# Patient Record
Sex: Female | Born: 1935 | Race: Black or African American | Smoking: Former smoker
Health system: Southern US, Community
[De-identification: ages and names within clinical notes are randomized; demographics above are authoritative.]

## PROBLEM LIST (undated history)

## (undated) DIAGNOSIS — T8859XA Other complications of anesthesia, initial encounter: Secondary | ICD-10-CM

## (undated) DIAGNOSIS — I2510 Atherosclerotic heart disease of native coronary artery without angina pectoris: Secondary | ICD-10-CM

## (undated) DIAGNOSIS — I509 Heart failure, unspecified: Secondary | ICD-10-CM

## (undated) DIAGNOSIS — C799 Secondary malignant neoplasm of unspecified site: Secondary | ICD-10-CM

## (undated) DIAGNOSIS — J3800 Paralysis of vocal cords and larynx, unspecified: Secondary | ICD-10-CM

## (undated) DIAGNOSIS — T4145XA Adverse effect of unspecified anesthetic, initial encounter: Secondary | ICD-10-CM

## (undated) DIAGNOSIS — C801 Malignant (primary) neoplasm, unspecified: Secondary | ICD-10-CM

## (undated) DIAGNOSIS — J449 Chronic obstructive pulmonary disease, unspecified: Secondary | ICD-10-CM

## (undated) DIAGNOSIS — I10 Essential (primary) hypertension: Secondary | ICD-10-CM

## (undated) HISTORY — PX: BREAST SURGERY: SHX581

## (undated) HISTORY — PX: SMALL INTESTINE SURGERY: SHX150

## (undated) HISTORY — PX: ABDOMINAL SURGERY: SHX537

## (undated) HISTORY — PX: THYROID SURGERY: SHX805

## (undated) HISTORY — PX: TRACHEOSTOMY: SUR1362

## (undated) HISTORY — PX: CARDIAC SURGERY: SHX584

---

## 2013-03-04 ENCOUNTER — Telehealth: Payer: Self-pay | Admitting: *Deleted

## 2013-03-04 NOTE — Telephone Encounter (Signed)
Received paperwork and confirmed 03/10/13 appt w/ pts daughter Johnny Bridge).  Mailed before appt letter & packet to pt.  Took paperwork to [facility] for chart.

## 2013-03-10 ENCOUNTER — Other Ambulatory Visit (HOSPITAL_BASED_OUTPATIENT_CLINIC_OR_DEPARTMENT_OTHER): Payer: Medicare Other | Admitting: Lab

## 2013-03-10 ENCOUNTER — Ambulatory Visit (HOSPITAL_BASED_OUTPATIENT_CLINIC_OR_DEPARTMENT_OTHER): Payer: Medicare Other | Admitting: Oncology

## 2013-03-10 ENCOUNTER — Encounter: Payer: Self-pay | Admitting: Oncology

## 2013-03-10 ENCOUNTER — Ambulatory Visit: Payer: Medicare Other

## 2013-03-10 ENCOUNTER — Other Ambulatory Visit: Payer: Self-pay | Admitting: Emergency Medicine

## 2013-03-10 ENCOUNTER — Telehealth: Payer: Self-pay | Admitting: Oncology

## 2013-03-10 VITALS — BP 114/67 | HR 79 | Temp 98.4°F | Resp 20 | Wt 142.2 lb

## 2013-03-10 DIAGNOSIS — C50919 Malignant neoplasm of unspecified site of unspecified female breast: Secondary | ICD-10-CM

## 2013-03-10 DIAGNOSIS — C50912 Malignant neoplasm of unspecified site of left female breast: Secondary | ICD-10-CM

## 2013-03-10 DIAGNOSIS — C772 Secondary and unspecified malignant neoplasm of intra-abdominal lymph nodes: Secondary | ICD-10-CM

## 2013-03-10 LAB — COMPREHENSIVE METABOLIC PANEL (CC13)
BUN: 16.5 mg/dL (ref 7.0–26.0)
CO2: 23 mEq/L (ref 22–29)
Creatinine: 1.5 mg/dL — ABNORMAL HIGH (ref 0.6–1.1)
Glucose: 93 mg/dl (ref 70–140)
Total Bilirubin: 0.42 mg/dL (ref 0.20–1.20)

## 2013-03-10 LAB — CBC WITH DIFFERENTIAL/PLATELET
Eosinophils Absolute: 0.1 10*3/uL (ref 0.0–0.5)
HCT: 42.8 % (ref 34.8–46.6)
LYMPH%: 33.1 % (ref 14.0–49.7)
MCV: 92.4 fL (ref 79.5–101.0)
MONO#: 0.9 10*3/uL (ref 0.1–0.9)
MONO%: 11.4 % (ref 0.0–14.0)
NEUT#: 3.9 10*3/uL (ref 1.5–6.5)
NEUT%: 53.1 % (ref 38.4–76.8)
Platelets: 222 10*3/uL (ref 145–400)
WBC: 7.4 10*3/uL (ref 3.9–10.3)

## 2013-03-10 MED ORDER — LETROZOLE 2.5 MG PO TABS: 2.5000 mg | ORAL_TABLET | Freq: Every day | ORAL | Status: DC

## 2013-03-10 NOTE — Progress Notes (Signed)
Checked in new pt with no financial concerns. °

## 2013-03-10 NOTE — Telephone Encounter (Signed)
, °

## 2013-03-10 NOTE — Patient Instructions (Addendum)
Begin femara 2.5 mg daily for your breast cancer  We discussed the side effects  Letrozole tablets What is this medicine? LETROZOLE (LET roe zole) blocks the production of estrogen. Certain types of breast cancer grow under the influence of estrogen. Letrozole helps block tumor growth. This medicine is used to treat advanced breast cancer in postmenopausal women. This medicine may be used for other purposes; ask your health care provider or pharmacist if you have questions. What should I tell my health care provider before I take this medicine? They need to know if you have any of these conditions: -liver disease -osteoporosis (weak bones) -an unusual or allergic reaction to letrozole, other medicines, foods, dyes, or preservatives -pregnant or trying to get pregnant -breast-feeding How should I use this medicine? Take this medicine by mouth with a glass of water. You may take it with or without food. Follow the directions on the prescription label. Take your medicine at regular intervals. Do not take your medicine more often than directed. Do not stop taking except on your doctor's advice. Talk to your pediatrician regarding the use of this medicine in children. Special care may be needed. Overdosage: If you think you have taken too much of this medicine contact a poison control center or emergency room at once. NOTE: This medicine is only for you. Do not share this medicine with others. What if I miss a dose? If you miss a dose, take it as soon as you can. If it is almost time for your next dose, take only that dose. Do not take double or extra doses. What may interact with this medicine? Do not take this medicine with any of the following medications: -estrogens, like hormone replacement therapy or birth control pills This medicine may also interact with the following medications: -dietary supplements such as androstenedione or DHEA -prasterone -tamoxifen This list may not describe  all possible interactions. Give your health care provider a list of all the medicines, herbs, non-prescription drugs, or dietary supplements you use. Also tell them if you smoke, drink alcohol, or use illegal drugs. Some items may interact with your medicine. What should I watch for while using this medicine? Visit your doctor or health care professional for regular check-ups to monitor your condition. Do not use this drug if you are pregnant. Serious side effects to an unborn child are possible. Talk to your doctor or pharmacist for more information. You may get drowsy or dizzy. Do not drive, use machinery, or do anything that needs mental alertness until you know how this medicine affects you. Do not stand or sit up quickly, especially if you are an older patient. This reduces the risk of dizzy or fainting spells. What side effects may I notice from receiving this medicine? Side effects that you should report to your doctor or health care professional as soon as possible: -allergic reactions like skin rash, itching, or hives -bone fracture -chest pain -difficulty breathing or shortness of breath -severe pain, swelling, warmth in the leg -unusually weak or tired -vaginal bleeding Side effects that usually do not require medical attention (report to your doctor or health care professional if they continue or are bothersome): -bone, back, joint, or muscle pain -dizziness -fatigue -fluid retention -headache -hot flashes, night sweats -nausea -weight gain This list may not describe all possible side effects. Call your doctor for medical advice about side effects. You may report side effects to FDA at 1-800-FDA-1088. Where should I keep my medicine? Keep out of the reach  of children. Store between 15 and 30 degrees C (59 and 86 degrees F). Throw away any unused medicine after the expiration date. NOTE: This sheet is a summary. It may not cover all possible information. If you have questions  about this medicine, talk to your doctor, pharmacist, or health care provider.  2013, [employer]vier/[employer] Standard. (08/28/2007 4:43:44 PM)

## 2013-03-21 NOTE — Progress Notes (Signed)
Danielle Bullock [id number] 1922-03-12 79 y.o. 03/21/2013 8:06 PM  CC Dr. Ovidio Kin Dr. Dorothyann Peng  REASON FOR CONSULTATION:  79 year old female with metastatic carcinoma unknown primary. patient is relocating to [facility] to be closer to her family.  STAGE:  Stage IV with liver metastasis.   REFERRING PHYSICIAN: Dr. Spero Curb  HISTORY OF PRESENT ILLNESS:  Danielle Bullock is a 79 y.o. female.  With multiple medical problems including COPD congestive heart failure coronary artery disease. Patient developed hoarseness for about a 3 month period. She went to be in 2 for an evaluation and was found to have one of local cord paralysis. She subsequently had CT scans performed of the chest. These scans failed to show any definite mediastinal adenopathy or other primary pathology but it did show multifocal bilateral nodules in both lungs. In the liver she was found to have a 2.5 x 3 cm lesion suspicious for metastatic disease. Patient apparently has a history of having had breast cancer about 10 years ago which was treated with a left mastectomy and 5 years of tamoxifen. Because of the liver lesion patient had a biopsy performed that showed carcinoma with some neuroendocrine features. Unknown primary. Patient had CA 27-29 marker that was moderately elevated 74.3 CEA 10.3 and  Chromogranin A. Was borderline elevated at 7 an alpha-fetoprotein was negative.special stains were performed on the biopsy including EGF mutation which was negative therefore making it difficult to treat with Tarceva or a thick neck. She was also HER-2/neu negative. Past ID gene RA was sent but there was no time to be completed. Patient has relocated to [facility].  Patient's original oncologist recommended that he patient to put on aromatase inhibitor I letrozole 2.5 mg daily. Other 8 discussion was chemotherapy for lung cancer with pemetrexed and carboplatinum or pemetrexed alone. Patient did not  want to have chemotherapy.   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  #3 vocal cord paresis  #5 hyperlipidemia, hypertension, ASHD, COPD Past Surgical History: Patient is status post mastectomy Family History: noncontributory Social History History  Substance Use Topics  . Smoking status: Not on file  . Smokeless tobacco: Not on file  . Alcohol Use: Not on file    Allergies: No Known Allergies  Current Medications: Current Outpatient Prescriptions  Medication Sig Dispense Refill  . albuterol (PROAIR HFA) 108 (90 BASE) MCG/ACT inhaler Inhale 2 puffs into the lungs every 6 (six) hours as needed for wheezing.      Marland Kitchen amLODipine-benazepril (LOTREL) 10-20 MG per capsule Take 1 capsule by mouth daily.      Marland Kitchen aspirin 325 MG tablet Take 325 mg by mouth daily.      . budesonide (PULMICORT) 0.5 MG/2ML nebulizer solution Take 0.5 mg by nebulization 2 (two) times daily.      . Calcium Carbonate (CALCIUM 600 PO) Take 600 mg by mouth.      . Cholecalciferol (VITAMIN D3) 2000 UNITS TABS Take 2,000 Int'l Units by mouth daily.      . clopidogrel (PLAVIX) 75 MG tablet Take 75 mg by mouth daily.      Marland Kitchen ezetimibe (ZETIA) 10 MG tablet Take 10 mg by mouth daily.      . furosemide (LASIX) 40 MG tablet Take 40 mg by mouth daily.      Marland Kitchen lovastatin (MEVACOR) 40 MG tablet Take 80 mg  by mouth at bedtime.      . potassium chloride SA (K-DUR,KLOR-CON) 20 MEQ tablet Take 20 mEq by mouth daily.      . propranolol (INDERAL) 40 MG tablet Take 40 mg by mouth daily.      . Sennosides (SENNA LAX PO) Take by mouth.      . sodium bicarbonate 650 MG tablet Take 650 mg by mouth 2 (two) times daily.      Marland Kitchen letrozole (FEMARA) 2.5 MG tablet Take 1 tablet (2.5 mg total) by mouth daily.  90 tablet  6   No current facility-administered medications for this visit.    OB/GYN  History: patient had a hysterectomy in her 30s she had been on hormone rplacement therapy stopped about 10 years ago she is nulliparous  Fertility Discussion:n/a Prior History of Cancer: yes  Health Maintenance:  Colonoscopy yes Bone Density yes Last PAP smear unknown  ECOG PERFORMANCE STATUS: 1 - Symptomatic but completely ambulatory  Genetic Counseling/testing: no  REVIEW OF SYSTEMS:  Patient does complain of irregular heartbeat she does have hearing loss and wears glasses she has shortness of breath on exertion she does complain of easy bruising arthritis and joint pains and numbness in the hands. Remainder of the 14 point review of systems is negative.  PHYSICAL EXAMINATION: Blood pressure 114/67, pulse 79, temperature 98.4 F (36.9 C), temperature source Oral, resp. rate 20, weight 142 lb 3.2 oz (64.501 kg).  WUJ:WJXBJ, healthy, no distress, well nourished and well developed SKIN: skin color, texture, turgor are normal HEAD: Normocephalic EYES: PERRLA, EOMI, Conjunctiva are pink and non-injected EARS: External ears normal OROPHARYNX:no exudate, no erythema and lips, buccal mucosa, and tongue normal  NECK: supple, no adenopathy LYMPH:  no palpable lymphadenopathy, no hepatosplenomegaly BREAST:left breast normal without mass, skin or nipple changes or axillary nodes, surgical scars noted without any evidence of local recurrence LUNGS: clear to auscultation  HEART: regular rate & rhythm, no murmurs and no gallops ABDOMEN:abdomen soft, non-tender, normal bowel sounds and no masses or organomegaly BACK: Back symmetric, no curvature., No CVA tenderness EXTREMITIES:no edema, no clubbing, no cyanosis  NEURO: alert & oriented x 3 with fluent speech, no focal motor/sensory deficits, gait normal     STUDIES/RESULTS: No results found.   LABS:    Chemistry      Component Value Date/Time   NA 139 03/10/2013 1101   K 4.0 03/10/2013 1101   CO2 23 03/10/2013 1101   BUN 16.5  03/10/2013 1101   CREATININE 1.5* 03/10/2013 1101      Component Value Date/Time   CALCIUM 9.1 03/10/2013 1101   ALKPHOS 70 03/10/2013 1101   AST 27 03/10/2013 1101   ALT 22 03/10/2013 1101   BILITOT 0.42 03/10/2013 1101      Lab Results  Component Value Date   WBC 7.4 03/10/2013   HGB 14.0 03/10/2013   HCT 42.8 03/10/2013   MCV 92.4 03/10/2013   PLT 222 03/10/2013       PATHOLOGY: outdide pathology reviewed  ASSESSMENT    79 year old female with widely metastatic adenocarcinoma with neuroendocrine features that was ER positive EGFR negative and HER-2/neu negative.primary is most likely lung or a breast cancer. But since the tumor is ER positive and the patient wants to avoid any kind of chemotherapy she would be a good candidate for antiestrogen therapy with an aromatase inhibitor such as letrozole 2.5 mg daily. We discussed risks benefits rationale and side effects. We also discussed chemotherapy but she declines it as  does her family.  Clinical Trial Eligibility: no Multidisciplinary conference discussion no     PLAN:    #1 proceed with letrozole 2.5 mg daily.  #2 I will see the patient back in 3 to months time.        Discussion: Patient is being treated per NCCN breast cancer care guidelines appropriate for stage.IV   Thank you so much for allowing me to participate in the care of Claiborne Billings. I will continue to follow up the patient with you and assist in her care.  All questions were answered. The patient knows to call the clinic with any problems, questions or concerns. We can certainly see the patient much sooner if necessary.  I spent 40 minutes counseling the patient face to face. The total time spent in the appointment was 55 minutes.  Drue Second, MD Medical/Oncology [facility] [phone] (beeper) [phone] (Office)

## 2013-04-05 ENCOUNTER — Telehealth: Payer: Self-pay | Admitting: Oncology

## 2013-04-05 NOTE — Telephone Encounter (Signed)
, °

## 2013-04-09 ENCOUNTER — Ambulatory Visit: Payer: Medicare Other | Admitting: Oncology

## 2013-04-09 ENCOUNTER — Other Ambulatory Visit: Payer: Medicare Other

## 2013-04-12 ENCOUNTER — Encounter: Payer: Self-pay | Admitting: *Deleted

## 2013-04-12 NOTE — Progress Notes (Signed)
Mailed after appt letter to pt. 

## 2013-04-15 ENCOUNTER — Emergency Department (HOSPITAL_COMMUNITY): Payer: Medicare Other

## 2013-04-15 ENCOUNTER — Inpatient Hospital Stay (HOSPITAL_COMMUNITY)
Admission: EM | Admit: 2013-04-15 | Discharge: 2013-04-19 | DRG: 189 | Disposition: A | Discharge status: Home Health | Payer: Medicare Other | Attending: Internal Medicine | Admitting: Internal Medicine

## 2013-04-15 ENCOUNTER — Telehealth: Payer: Self-pay | Admitting: *Deleted

## 2013-04-15 ENCOUNTER — Encounter (HOSPITAL_COMMUNITY): Payer: Self-pay | Admitting: Emergency Medicine

## 2013-04-15 DIAGNOSIS — C7800 Secondary malignant neoplasm of unspecified lung: Secondary | ICD-10-CM | POA: Diagnosis present

## 2013-04-15 DIAGNOSIS — J40 Bronchitis, not specified as acute or chronic: Secondary | ICD-10-CM

## 2013-04-15 DIAGNOSIS — Z79899 Other long term (current) drug therapy: Secondary | ICD-10-CM

## 2013-04-15 DIAGNOSIS — I129 Hypertensive chronic kidney disease with stage 1 through stage 4 chronic kidney disease, or unspecified chronic kidney disease: Secondary | ICD-10-CM | POA: Diagnosis present

## 2013-04-15 DIAGNOSIS — J9600 Acute respiratory failure, unspecified whether with hypoxia or hypercapnia: Principal | ICD-10-CM | POA: Diagnosis present

## 2013-04-15 DIAGNOSIS — C50919 Malignant neoplasm of unspecified site of unspecified female breast: Secondary | ICD-10-CM | POA: Diagnosis present

## 2013-04-15 DIAGNOSIS — J189 Pneumonia, unspecified organism: Secondary | ICD-10-CM | POA: Diagnosis present

## 2013-04-15 DIAGNOSIS — C787 Secondary malignant neoplasm of liver and intrahepatic bile duct: Secondary | ICD-10-CM | POA: Diagnosis present

## 2013-04-15 DIAGNOSIS — I509 Heart failure, unspecified: Secondary | ICD-10-CM | POA: Diagnosis present

## 2013-04-15 DIAGNOSIS — J441 Chronic obstructive pulmonary disease with (acute) exacerbation: Secondary | ICD-10-CM | POA: Diagnosis present

## 2013-04-15 DIAGNOSIS — I10 Essential (primary) hypertension: Secondary | ICD-10-CM

## 2013-04-15 DIAGNOSIS — N183 Chronic kidney disease, stage 3 unspecified: Secondary | ICD-10-CM | POA: Diagnosis present

## 2013-04-15 DIAGNOSIS — I2510 Atherosclerotic heart disease of native coronary artery without angina pectoris: Secondary | ICD-10-CM | POA: Diagnosis present

## 2013-04-15 HISTORY — DX: Malignant (primary) neoplasm, unspecified: C80.1

## 2013-04-15 HISTORY — DX: Other complications of anesthesia, initial encounter: T88.59XA

## 2013-04-15 HISTORY — DX: Heart failure, unspecified: I50.9

## 2013-04-15 HISTORY — DX: Secondary malignant neoplasm of unspecified site: C79.9

## 2013-04-15 HISTORY — DX: Adverse effect of unspecified anesthetic, initial encounter: T41.45XA

## 2013-04-15 HISTORY — DX: Chronic obstructive pulmonary disease, unspecified: J44.9

## 2013-04-15 LAB — URINALYSIS, ROUTINE W REFLEX MICROSCOPIC
Bilirubin Urine: NEGATIVE
Glucose, UA: NEGATIVE mg/dL
Hgb urine dipstick: NEGATIVE
Ketones, ur: NEGATIVE mg/dL
Nitrite: NEGATIVE
Specific Gravity, Urine: 1.009 (ref 1.005–1.030)
pH: 6.5 (ref 5.0–8.0)

## 2013-04-15 LAB — CBC WITH DIFFERENTIAL/PLATELET
Basophils Absolute: 0 10*3/uL (ref 0.0–0.1)
Basophils Absolute: 0 10*3/uL (ref 0.0–0.1)
Basophils Relative: 0 % (ref 0–1)
Basophils Relative: 0 % (ref 0–1)
Eosinophils Absolute: 0 10*3/uL (ref 0.0–0.7)
Eosinophils Relative: 0 % (ref 0–5)
Hemoglobin: 14.8 g/dL (ref 12.0–15.0)
Lymphocytes Relative: 34 % (ref 12–46)
Lymphocytes Relative: 23 % (ref 12–46)
MCHC: 34.5 g/dL (ref 30.0–36.0)
MCHC: 33.9 g/dL (ref 30.0–36.0)
MCV: 89.7 fL (ref 78.0–100.0)
Monocytes Absolute: 0.1 10*3/uL (ref 0.1–1.0)
Neutro Abs: 4.5 10*3/uL (ref 1.7–7.7)
Neutro Abs: 4.8 10*3/uL (ref 1.7–7.7)
Neutrophils Relative %: 57 % (ref 43–77)
Platelets: 230 10*3/uL (ref 150–400)
Platelets: 244 10*3/uL (ref 150–400)
RBC: 4.94 MIL/uL (ref 3.87–5.11)
RDW: 13.9 % (ref 11.5–15.5)
RDW: 13.9 % (ref 11.5–15.5)
WBC: 7.9 10*3/uL (ref 4.0–10.5)
WBC: 6.3 10*3/uL (ref 4.0–10.5)

## 2013-04-15 LAB — URINE MICROSCOPIC-ADD ON

## 2013-04-15 LAB — COMPREHENSIVE METABOLIC PANEL
ALT: 20 U/L (ref 0–35)
ALT: 21 U/L (ref 0–35)
AST: 27 U/L (ref 0–37)
AST: 28 U/L (ref 0–37)
Albumin: 4.2 g/dL (ref 3.5–5.2)
Alkaline Phosphatase: 82 U/L (ref 39–117)
CO2: 24 mEq/L (ref 19–32)
CO2: 20 mEq/L (ref 19–32)
Calcium: 9.8 mg/dL (ref 8.4–10.5)
Chloride: 98 mEq/L (ref 96–112)
Creatinine, Ser: 1.44 mg/dL — ABNORMAL HIGH (ref 0.50–1.10)
GFR calc Af Amer: 34 mL/min — ABNORMAL LOW (ref >90)
GFR calc Af Amer: 36 mL/min — ABNORMAL LOW (ref >90)
GFR calc non Af Amer: 29 mL/min — ABNORMAL LOW (ref >90)
GFR calc non Af Amer: 31 mL/min — ABNORMAL LOW (ref >90)
Glucose, Bld: 282 mg/dL — ABNORMAL HIGH (ref 70–99)
Potassium: 3.7 mEq/L (ref 3.5–5.1)
Potassium: 3.7 mEq/L (ref 3.5–5.1)
Sodium: 136 mEq/L (ref 135–145)
Total Bilirubin: 0.6 mg/dL (ref 0.3–1.2)
Total Bilirubin: 0.5 mg/dL (ref 0.3–1.2)
Total Protein: 8 g/dL (ref 6.0–8.3)

## 2013-04-15 LAB — APTT: aPTT: 30 seconds (ref 24–37)

## 2013-04-15 LAB — PROTIME-INR
INR: 0.98 (ref 0.00–1.49)
INR: 1.02 (ref 0.00–1.49)

## 2013-04-15 LAB — PHOSPHORUS: Phosphorus: 2.7 mg/dL (ref 2.3–4.6)

## 2013-04-15 LAB — MAGNESIUM: Magnesium: 2 mg/dL (ref 1.5–2.5)

## 2013-04-15 MED ORDER — PROPRANOLOL HCL 40 MG PO TABS
40.0000 mg | ORAL_TABLET | Freq: Every day | ORAL | Status: DC
  Administered 2013-04-15 – 2013-04-19 (×5): 40 mg via ORAL
  Filled 2013-04-15 (×5): qty 1

## 2013-04-15 MED ORDER — TECHNETIUM TO 99M ALBUMIN AGGREGATED
5.7000 | Freq: Once | INTRAVENOUS | Status: AC | PRN
  Administered 2013-04-15: 5.7 via INTRAVENOUS

## 2013-04-15 MED ORDER — SIMVASTATIN 40 MG PO TABS
40.0000 mg | ORAL_TABLET | Freq: Every day | ORAL | Status: DC
  Administered 2013-04-15 – 2013-04-18 (×4): 40 mg via ORAL
  Filled 2013-04-15 (×5): qty 1

## 2013-04-15 MED ORDER — ALBUTEROL SULFATE (5 MG/ML) 0.5% IN NEBU: 2.5000 mg | INHALATION_SOLUTION | RESPIRATORY_TRACT | Status: AC | PRN

## 2013-04-15 MED ORDER — ONDANSETRON HCL 4 MG/2ML IJ SOLN: 4.0000 mg | Freq: Four times a day (QID) | INTRAMUSCULAR | Status: DC | PRN

## 2013-04-15 MED ORDER — TECHNETIUM TC 99M DIETHYLENETRIAME-PENTAACETIC ACID: 43.9000 | Freq: Once | INTRAVENOUS | Status: AC | PRN

## 2013-04-15 MED ORDER — LETROZOLE 2.5 MG PO TABS
2.5000 mg | ORAL_TABLET | Freq: Every day | ORAL | Status: DC
  Administered 2013-04-15 – 2013-04-19 (×5): 2.5 mg via ORAL
  Filled 2013-04-15 (×6): qty 1

## 2013-04-15 MED ORDER — IPRATROPIUM BROMIDE 0.02 % IN SOLN
0.5000 mg | Freq: Once | RESPIRATORY_TRACT | Status: AC
  Administered 2013-04-15: 0.5 mg via RESPIRATORY_TRACT
  Filled 2013-04-15: qty 2.5

## 2013-04-15 MED ORDER — MORPHINE SULFATE 2 MG/ML IJ SOLN: 1.0000 mg | INTRAMUSCULAR | Status: DC | PRN

## 2013-04-15 MED ORDER — VITAMIN D3 50 MCG (2000 UT) PO TABS: 2000.0000 [IU] | ORAL_TABLET | Freq: Every day | ORAL | Status: DC

## 2013-04-15 MED ORDER — IPRATROPIUM BROMIDE 0.02 % IN SOLN
0.5000 mg | RESPIRATORY_TRACT | Status: DC
  Administered 2013-04-15 – 2013-04-17 (×9): 0.5 mg via RESPIRATORY_TRACT
  Filled 2013-04-15 (×9): qty 2.5

## 2013-04-15 MED ORDER — ASPIRIN 325 MG PO TABS
325.0000 mg | ORAL_TABLET | Freq: Every day | ORAL | Status: DC
  Administered 2013-04-15 – 2013-04-19 (×5): 325 mg via ORAL
  Filled 2013-04-15 (×5): qty 1

## 2013-04-15 MED ORDER — EZETIMIBE 10 MG PO TABS
10.0000 mg | ORAL_TABLET | Freq: Every day | ORAL | Status: DC
  Administered 2013-04-15 – 2013-04-19 (×5): 10 mg via ORAL
  Filled 2013-04-15 (×5): qty 1

## 2013-04-15 MED ORDER — IPRATROPIUM BROMIDE 0.02 % IN SOLN: 0.5000 mg | RESPIRATORY_TRACT | Status: DC | PRN

## 2013-04-15 MED ORDER — LEVOFLOXACIN IN D5W 750 MG/150ML IV SOLN: 750.0000 mg | INTRAVENOUS | Status: DC

## 2013-04-15 MED ORDER — ACETAMINOPHEN 650 MG RE SUPP: 650.0000 mg | Freq: Four times a day (QID) | RECTAL | Status: DC | PRN

## 2013-04-15 MED ORDER — ONDANSETRON HCL 4 MG PO TABS: 4.0000 mg | ORAL_TABLET | Freq: Four times a day (QID) | ORAL | Status: DC | PRN

## 2013-04-15 MED ORDER — ALBUTEROL SULFATE (5 MG/ML) 0.5% IN NEBU
2.5000 mg | INHALATION_SOLUTION | Freq: Once | RESPIRATORY_TRACT | Status: AC
  Administered 2013-04-15: 2.5 mg via RESPIRATORY_TRACT
  Filled 2013-04-15: qty 0.5

## 2013-04-15 MED ORDER — CLOPIDOGREL BISULFATE 75 MG PO TABS
75.0000 mg | ORAL_TABLET | Freq: Every day | ORAL | Status: DC
  Administered 2013-04-15 – 2013-04-19 (×5): 75 mg via ORAL
  Filled 2013-04-15 (×5): qty 1

## 2013-04-15 MED ORDER — VITAMIN D3 25 MCG (1000 UNIT) PO TABS
2000.0000 [IU] | ORAL_TABLET | Freq: Every day | ORAL | Status: DC
  Administered 2013-04-15 – 2013-04-19 (×5): 2000 [IU] via ORAL
  Filled 2013-04-15 (×5): qty 2

## 2013-04-15 MED ORDER — SODIUM BICARBONATE 650 MG PO TABS
650.0000 mg | ORAL_TABLET | Freq: Two times a day (BID) | ORAL | Status: DC
  Administered 2013-04-15 – 2013-04-19 (×8): 650 mg via ORAL
  Filled 2013-04-15 (×9): qty 1

## 2013-04-15 MED ORDER — PREDNISONE 20 MG PO TABS
60.0000 mg | ORAL_TABLET | Freq: Once | ORAL | Status: AC
  Administered 2013-04-15: 60 mg via ORAL
  Filled 2013-04-15: qty 3

## 2013-04-15 MED ORDER — SODIUM CHLORIDE 0.9 % IV SOLN
INTRAVENOUS | Status: AC
  Administered 2013-04-15: 17:00:00 via INTRAVENOUS

## 2013-04-15 MED ORDER — HYDROCODONE-ACETAMINOPHEN 5-325 MG PO TABS: 1.0000 | ORAL_TABLET | ORAL | Status: DC | PRN

## 2013-04-15 MED ORDER — ONDANSETRON HCL 4 MG/2ML IJ SOLN: 4.0000 mg | Freq: Three times a day (TID) | INTRAMUSCULAR | Status: DC | PRN

## 2013-04-15 MED ORDER — ALBUTEROL SULFATE (5 MG/ML) 0.5% IN NEBU
2.5000 mg | INHALATION_SOLUTION | RESPIRATORY_TRACT | Status: DC
  Administered 2013-04-15 – 2013-04-17 (×9): 2.5 mg via RESPIRATORY_TRACT
  Filled 2013-04-15 (×9): qty 0.5

## 2013-04-15 MED ORDER — LEVOFLOXACIN IN D5W 500 MG/100ML IV SOLN
500.0000 mg | Freq: Once | INTRAVENOUS | Status: AC
  Administered 2013-04-15: 500 mg via INTRAVENOUS
  Filled 2013-04-15: qty 100

## 2013-04-15 MED ORDER — ACETAMINOPHEN 325 MG PO TABS: 650.0000 mg | ORAL_TABLET | Freq: Four times a day (QID) | ORAL | Status: DC | PRN

## 2013-04-15 NOTE — ED Provider Notes (Signed)
CSN: [id number]     Arrival date & time 04/15/13  1142 History   First MD Initiated Contact with Patient 04/15/13 1152     Chief Complaint  Patient presents with  . Shortness of Breath  . Wheezing  . Hemoptysis   (Consider location/radiation/quality/duration/timing/severity/associated sxs/prior Treatment) HPI Comments: Patient presents with a one-day history of shortness of breath, cough or sputum and wheezing. She has a history of COPD, CHF, metastatic breast cancer to the liver and lung currently on oral chemotherapy. She denies any sick contacts or travel. She moved from [place] one month ago. Denies any chest pain, nausea, fever.  Breathing is not improved with albuterol or atrovent. Breathing is worse with lying down and better with sitting up. She denies any leg pain or swelling. States compliance with medications. Denies any sick contacts.  The history is provided by the patient and a relative. The history is limited by the condition of the patient.    Past Medical History  Diagnosis Date  . Cancer   . Cancer, metastatic   . COPD (chronic obstructive pulmonary disease)   . CHF (congestive heart failure)   . Complication of anesthesia     hard to wake up   Past Surgical History  Procedure Laterality Date  . Thyroid surgery    . Cardiac surgery    . Abdominal surgery    . Breast surgery    . Small intestine surgery     History reviewed. No pertinent family history. History  Substance Use Topics  . Smoking status: Former Smoker    Quit date: 04/15/1969  . Smokeless tobacco: Never Used  . Alcohol Use: No   OB History   Grav Para Term Preterm Abortions TAB SAB Ect Mult Living                 Review of Systems  Constitutional: Positive for fatigue. Negative for fever, activity change and appetite change.  HENT: Positive for rhinorrhea.   Respiratory: Positive for cough, chest tightness, shortness of breath and wheezing.   Cardiovascular: Negative for chest pain.   Gastrointestinal: Negative for nausea, vomiting and abdominal pain.  Genitourinary: Negative for dysuria and hematuria.  Musculoskeletal: Negative for back pain.  Neurological: Negative for dizziness, weakness and headaches.  A complete 10 system review of systems was obtained and all systems are negative except as noted in the HPI and PMH.    Allergies  Review of patient's allergies indicates no known allergies.  Home Medications   Current Outpatient Rx  Name  Route  Sig  Dispense  Refill  . albuterol (PROAIR HFA) 108 (90 BASE) MCG/ACT inhaler   Inhalation   Inhale 2 puffs into the lungs every 6 (six) hours as needed for wheezing.         Marland Kitchen amLODipine-benazepril (LOTREL) 10-20 MG per capsule   Oral   Take 1 capsule by mouth daily.         Marland Kitchen aspirin 325 MG tablet   Oral   Take 325 mg by mouth daily.         . budesonide (PULMICORT) 0.5 MG/2ML nebulizer solution   Nebulization   Take 0.5 mg by nebulization 2 (two) times daily.         . Calcium Carbonate (CALCIUM 600 PO)   Oral   Take 600 mg by mouth.         . Cholecalciferol (VITAMIN D3) 2000 UNITS TABS   Oral   Take 2,000 Int'l Units by  mouth daily.         . clopidogrel (PLAVIX) 75 MG tablet   Oral   Take 75 mg by mouth daily.         Marland Kitchen ezetimibe (ZETIA) 10 MG tablet   Oral   Take 10 mg by mouth daily.         . furosemide (LASIX) 40 MG tablet   Oral   Take 40 mg by mouth daily.         Marland Kitchen letrozole (FEMARA) 2.5 MG tablet   Oral   Take 1 tablet (2.5 mg total) by mouth daily.   90 tablet   6   . lovastatin (MEVACOR) 40 MG tablet   Oral   Take 80 mg by mouth at bedtime.         Marland Kitchen OVER THE COUNTER MEDICATION   Apply externally   Apply 1 application topically daily as needed (for pain).         . potassium chloride SA (K-DUR,KLOR-CON) 20 MEQ tablet   Oral   Take 20 mEq by mouth daily.         . propranolol (INDERAL) 40 MG tablet   Oral   Take 40 mg by mouth daily.          . Sennosides (SENNA LAX PO)   Oral   Take by mouth.         . sodium bicarbonate 650 MG tablet   Oral   Take 650 mg by mouth 2 (two) times daily.          BP 145/58  Pulse 69  Temp(Src) 99 F (37.2 C) (Oral)  Resp 22  SpO2 97% Physical Exam  Constitutional: She is oriented to person, place, and time. She appears well-developed and well-nourished. She appears distressed.  Mildly increased for her breathing with bilateral rhonchi  inspiratory stridor when breathing with mouth open which family states is chronic and due to "collapsed vocal cord"  HENT:  Head: Normocephalic and atraumatic.  Mouth/Throat: Oropharynx is clear and moist. No oropharyngeal exudate.  Eyes: Conjunctivae and EOM are normal. Pupils are equal, round, and reactive to light.  Neck: Normal range of motion. Neck supple.  Cardiovascular: Normal rate, regular rhythm and normal heart sounds.   No murmur heard. Pulmonary/Chest: She is in respiratory distress. She has wheezes.  Course breath sounds bilaterally  Abdominal: Soft. There is no tenderness. There is no rebound and no guarding.  Musculoskeletal: Normal range of motion. She exhibits no edema and no tenderness.  Neurological: She is alert and oriented to person, place, and time. No cranial nerve deficit. She exhibits normal muscle tone. Coordination normal.  Skin: Skin is warm.    ED Course  Procedures (including critical care time) Labs Review Labs Reviewed  COMPREHENSIVE METABOLIC PANEL - Abnormal; Notable for the following:    Sodium 134 (*)    Creatinine, Ser 1.52 (*)    GFR calc non Af Amer 29 (*)    GFR calc Af Amer 34 (*)    All other components within normal limits  URINALYSIS, ROUTINE W REFLEX MICROSCOPIC - Abnormal; Notable for the following:    APPearance CLOUDY (*)    Leukocytes, UA SMALL (*)    All other components within normal limits  CBC WITH DIFFERENTIAL  TROPONIN I  PRO B NATRIURETIC PEPTIDE  PROTIME-INR  URINE  MICROSCOPIC-ADD ON   Imaging Review Dg Neck Soft Tissue  04/15/2013   CLINICAL DATA:  Short of breath, wheezing, hemoptysis  to  EXAM: NECK SOFT TISSUES - 1+ VIEW  COMPARISON:  Concurrently obtained chest x-ray  FINDINGS: There is no evidence of retropharyngeal soft tissue swelling or epiglottic enlargement. The cervical airway is unremarkable and no radio-opaque foreign body identified. Electronic device is in the bilateral external auditory canals are consistent with a hearing aids. Atherosclerotic plaque is noted within the left common carotid artery. Mild multilevel cervical spondylosis without focality. No acute fracture identified. Incompletely imaged median sternotomy. Atherosclerotic calcifications are noted in the transverse aorta. The visualized lung apices appear clear.  IMPRESSION: No focal soft tissue abnormality detected by conventional radiography.  Atherosclerosis including left carotid artery and aortic arch disease.   Electronically Signed   By: Malachy Moan M.D.   On: 04/15/2013 13:31   Dg Chest 2 View  04/15/2013   CLINICAL DATA:  Short of breath, cough  EXAM: CHEST  2 VIEW  COMPARISON:  None.  FINDINGS: Sternotomy wires overlie normal cardiac silhouette. No effusion, infiltrate, or pneumothorax. Degenerative osteophytosis of the thoracic spine. Left lymphadenectomy clips noted.  IMPRESSION: No active cardiopulmonary disease.   Electronically Signed   By: Genevive Bi M.D.   On: 04/15/2013 13:06   Nm Pulmonary Perf And Vent  04/15/2013   CLINICAL DATA:  Shortness of Breath  EXAM: NUCLEAR MEDICINE VENTILATION - PERFUSION LUNG SCAN  Views: Anterior, posterior, left lateral, right lateral, RPO, LPO, RAO, LAO -ventilation and perfusion  Radionuclide: Technetium 74m DTPA -ventilation; Technetium 52m macroaggregated albumin-perfusion  Dose: 43.9 mCi-ventilation; 5.7 mCi-perfusion  Route of administration: Inhalation -ventilation; intravenous -perfusion  COMPARISON:  Chest  radiograph April 15, 2013  FINDINGS: The ventilation study shows homogeneous and symmetric uptake of radiotracer bilaterally.  The perfusion study shows homogeneous and symmetric radiotracer uptake bilaterally.  There is no appreciable ventilation/ perfusion mismatch.  IMPRESSION: No ventilation or perfusion defects. Very low probability of pulmonary embolus.   Electronically Signed   By: Bretta Bang M.D.   On: 04/15/2013 15:14      MDM   1. Bronchitis   2. COPD exacerbation    Shortness of breath for the past day with productive cough. History of COPD, CHF, metastatic cancer.  CXR negative. EKG without acute change. Tachypnea with increased work of breathing but minimal wheezing.  Given nebs and steroids. No evidence of CHF.  Suspect bronchitis on top of COPD with COPD exacerbation.  Doubt CHF.  Need to rule out PE.  VQ is negative for PE Patient become tachypneic and SOB with ambulation. No hypoxia.  Increased work of breathing with worsening of chronic stridor with ambulation. Will admit for further treatments. IV levaquin given for bronchitis.     Glynn Octave, MD 04/15/13 [phone]

## 2013-04-15 NOTE — ED Notes (Signed)
Patient transported to X-ray-nuclear med

## 2013-04-15 NOTE — Telephone Encounter (Signed)
PT. IS COUGHING UP CLEAR MUCUS. SHE BECOMES SHORT OF BREATH AND IS "WORN OUT". HER INHALER AND NEBULIZER IS NOT HELPING. VERBAL ORDER AND READ BACK TO DR.KHAN- PT. NEEDS TO GO TO THE EMERGENCY ROOM. NOTIFIED PT'S DAUGHTER. SHE VOICES UNDERSTANDING AND IS TAKING HER MOTHER NOW.

## 2013-04-15 NOTE — H&P (Signed)
[facility] History and Physical  Danielle Bullock ZOX:[id number] DOB: Jul 20, 1921 DOA: 04/15/2013  Referring physician: ED physician PCP: No PCP Per Patient   Chief Complaint: shortness of breath   HPI:  Pt is 79 yo female with metastatic breast cancer with metastasis to lungs and liver who presents to [facility] ED with main concern of several days duration of progressively worsening shortness of breath associated with productive cough of yellow sputum, subjective fevers, chills, malaise and poor oral intake. PT denies similar events in the past and denies any specific aggravating or alleviating factors. Pt denies specific sick contacts or exposures, no recent hospitalizations. She denies chest pain, no abdominal or urinary concerns.   In ED, pt noted to have mild dyspnea and with oxygen saturations in high 80's to low 90's on RA> She was placed on oxygen and was stared on nebulizer tx, responded well and [facility] asked to admit for COPD/bronchitis management.   Assessment and Plan: Principal Problem:   COPD exacerbation - admit to medical floor - start with providing supportive care with BD's, empiric ABX, oxygen - follow up on sputum analysis, urine legionella and strep pneumo Active Problems:   CKD (chronic kidney disease), stage III - creatine overall at baseline - CBC in AM   HTN (hypertension) - monitor vitals per floor protocol - reasonable control on admission    CAD (coronary artery disease) - clinically stable   Code Status: Full Family Communication: Pt at bedside Disposition Plan: Admit to medical floor    Review of Systems:  Constitutional: NNegative for diaphoresis.  HENT: Negative for hearing loss, ear pain, nosebleeds, congestion, sore throat, neck pain, tinnitus and ear discharge.   Eyes: Negative for blurred vision, double vision, photophobia, pain, discharge and redness.  Respiratory: Per HPI Cardiovascular: Negative for chest pain, palpitations, orthopnea,  claudication and leg swelling.  Gastrointestinal: Negative for nausea, vomiting and abdominal pain. Negative for heartburn, constipation, blood in stool and melena.  Genitourinary: Negative for dysuria, urgency, frequency, hematuria and flank pain.  Musculoskeletal: Negative for myalgias, back pain, joint pain and falls.  Skin: Negative for itching and rash.  Neurological: Negative for dizziness and weakness. Negative for tingling, tremors, sensory change, speech change, focal weakness, loss of consciousness and headaches.  Endo/Heme/Allergies: Negative for environmental allergies and polydipsia. Does not bruise/bleed easily.  Psychiatric/Behavioral: Negative for suicidal ideas. The patient is not nervous/anxious.      Past Medical History  Diagnosis Date  . Cancer   . Cancer, metastatic   . COPD (chronic obstructive pulmonary disease)   . CHF (congestive heart failure)   . Complication of anesthesia     hard to wake up    Past Surgical History  Procedure Laterality Date  . Thyroid surgery    . Cardiac surgery    . Abdominal surgery    . Breast surgery    . Small intestine surgery      Social History:  reports that she quit smoking about 44 years ago. She has never used smokeless tobacco. She reports that she does not drink alcohol or use illicit drugs.  No Known Allergies  No known medical family history   Prior to Admission medications   Medication Sig Start Date End Date Taking? Authorizing Provider  albuterol (PROAIR HFA) 108 (90 BASE) MCG/ACT inhaler Inhale 2 puffs into the lungs every 6 (six) hours as needed for wheezing.   Yes Historical Provider, MD  amLODipine-benazepril (LOTREL) 10-20 MG per capsule Take 1 capsule by mouth daily.  Yes Historical Provider, MD  aspirin 325 MG tablet Take 325 mg by mouth daily.   Yes Historical Provider, MD  budesonide (PULMICORT) 0.5 MG/2ML nebulizer solution Take 0.5 mg by nebulization 2 (two) times daily.   Yes Historical Provider,  MD  Calcium Carbonate (CALCIUM 600 PO) Take 600 mg by mouth.   Yes Historical Provider, MD  Cholecalciferol (VITAMIN D3) 2000 UNITS TABS Take 2,000 Int'l Units by mouth daily.   Yes Historical Provider, MD  clopidogrel (PLAVIX) 75 MG tablet Take 75 mg by mouth daily.   Yes Historical Provider, MD  ezetimibe (ZETIA) 10 MG tablet Take 10 mg by mouth daily.   Yes Historical Provider, MD  furosemide (LASIX) 40 MG tablet Take 40 mg by mouth daily.   Yes Historical Provider, MD  letrozole (FEMARA) 2.5 MG tablet Take 1 tablet (2.5 mg total) by mouth daily. 03/10/13  Yes Victorino December, MD  lovastatin (MEVACOR) 40 MG tablet Take 80 mg by mouth at bedtime.   Yes Historical Provider, MD  OVER THE COUNTER MEDICATION Apply 1 application topically daily as needed (for pain).   Yes Historical Provider, MD  potassium chloride SA (K-DUR,KLOR-CON) 20 MEQ tablet Take 20 mEq by mouth daily.   Yes Historical Provider, MD  propranolol (INDERAL) 40 MG tablet Take 40 mg by mouth daily.   Yes Historical Provider, MD  Sennosides (SENNA LAX PO) Take by mouth.   Yes Historical Provider, MD  sodium bicarbonate 650 MG tablet Take 650 mg by mouth 2 (two) times daily.   Yes Historical Provider, MD    Physical Exam: Filed Vitals:   04/15/13 1400 04/15/13 1532 04/15/13 1535 04/15/13 1626  BP: 138/60  145/58   Pulse: 71  69   Temp:   99 F (37.2 C)   TempSrc:   Oral   Resp:   22   SpO2: 97% 96% 97% 97%    Physical Exam  Constitutional: Appears well-developed and well-nourished. No distress.  HENT: Normocephalic. External right and left ear normal. Oropharynx is clear and moist.  Eyes: Conjunctivae and EOM are normal. PERRLA, no scleral icterus.  Neck: Normal ROM. Neck supple. No JVD. No tracheal deviation. No thyromegaly.  CVS: RRR, S1/S2 +, no murmurs, no gallops, no carotid bruit.  Pulmonary: Course breath sounds, diminished breath sounds at bases, mild end expiratory wheezing  Abdominal: Soft. BS +,  no  distension, tenderness, rebound or guarding.  Musculoskeletal: Normal range of motion. No edema and no tenderness.  Lymphadenopathy: No lymphadenopathy noted, cervical, inguinal. Neuro: Alert. Normal reflexes, muscle tone coordination. No cranial nerve deficit. Skin: Skin is warm and dry. No rash noted. Not diaphoretic. No erythema. No pallor.  Psychiatric: Normal mood and affect. Behavior, judgment, thought content normal.   Labs on Admission:  Basic Metabolic Panel:  Recent Labs Lab 04/15/13 1226  NA 134*  K 3.7  CL 98  CO2 24  GLUCOSE 97  BUN 21  CREATININE 1.52*  CALCIUM 10.0   Liver Function Tests:  Recent Labs Lab 04/15/13 1226  AST 27  ALT 20  ALKPHOS 82  BILITOT 0.6  PROT 7.8  ALBUMIN 4.2   No results found for this basename: LIPASE, AMYLASE,  in the last 168 hours No results found for this basename: AMMONIA,  in the last 168 hours CBC:  Recent Labs Lab 04/15/13 1226  WBC 7.9  NEUTROABS 4.5  HGB 14.8  HCT 42.9  MCV 89.7  PLT 230   Cardiac Enzymes:  Recent Labs Lab  04/15/13 1226  TROPONINI <0.30   Radiological Exams on Admission: Dg Neck Soft Tissue  04/15/2013   CLINICAL DATA:  Short of breath, wheezing, hemoptysis to  EXAM: NECK SOFT TISSUES - 1+ VIEW  COMPARISON:  Concurrently obtained chest x-ray  FINDINGS: There is no evidence of retropharyngeal soft tissue swelling or epiglottic enlargement. The cervical airway is unremarkable and no radio-opaque foreign body identified. Electronic device is in the bilateral external auditory canals are consistent with a hearing aids. Atherosclerotic plaque is noted within the left common carotid artery. Mild multilevel cervical spondylosis without focality. No acute fracture identified. Incompletely imaged median sternotomy. Atherosclerotic calcifications are noted in the transverse aorta. The visualized lung apices appear clear.  IMPRESSION: No focal soft tissue abnormality detected by conventional radiography.   Atherosclerosis including left carotid artery and aortic arch disease.   Electronically Signed   By: Malachy Moan M.D.   On: 04/15/2013 13:31   Dg Chest 2 View  04/15/2013   CLINICAL DATA:  Short of breath, cough  EXAM: CHEST  2 VIEW  COMPARISON:  None.  FINDINGS: Sternotomy wires overlie normal cardiac silhouette. No effusion, infiltrate, or pneumothorax. Degenerative osteophytosis of the thoracic spine. Left lymphadenectomy clips noted.  IMPRESSION: No active cardiopulmonary disease.   Electronically Signed   By: Genevive Bi M.D.   On: 04/15/2013 13:06   Nm Pulmonary Perf And Vent  04/15/2013   CLINICAL DATA:  Shortness of Breath  EXAM: NUCLEAR MEDICINE VENTILATION - PERFUSION LUNG SCAN  Views: Anterior, posterior, left lateral, right lateral, RPO, LPO, RAO, LAO -ventilation and perfusion  Radionuclide: Technetium 87m DTPA -ventilation; Technetium 31m macroaggregated albumin-perfusion  Dose: 43.9 mCi-ventilation; 5.7 mCi-perfusion  Route of administration: Inhalation -ventilation; intravenous -perfusion  COMPARISON:  Chest radiograph April 15, 2013  FINDINGS: The ventilation study shows homogeneous and symmetric uptake of radiotracer bilaterally.  The perfusion study shows homogeneous and symmetric radiotracer uptake bilaterally.  There is no appreciable ventilation/ perfusion mismatch.  IMPRESSION: No ventilation or perfusion defects. Very low probability of pulmonary embolus.   Electronically Signed   By: Bretta Bang M.D.   On: 04/15/2013 15:14    EKG: Normal sinus rhythm, no ST/T wave changes  Debbora Presto, MD  [facility] Pager [phone]  If 7PM-7AM, please contact night-coverage [email or web address] Password [email or web address] 04/15/2013, 5:56 PM

## 2013-04-15 NOTE — ED Notes (Signed)
Patient transported to X-ray 

## 2013-04-15 NOTE — Progress Notes (Signed)
Pharmacy Consult Note: Antibiotic Adjustment for Renal Function  79 yo female starting levaquin for COPD exacerbation. Received a dose of levaquin 500mg  IV x 1 in the ED ~17:00.  SCr 1.52 CrCl ~28 ml/min/1.30m2 (normalized)  Tmax 99 WBC 7.9  Blood cultures ordered  Plan: Levaquin 750mg  IV q24h x 5 days ordered. Based on CrCl, will adjust to 750mg  IV q48h x 2 doses starting 10/17 to cover 5 days total therapy.  Loralee Pacas, PharmD, BCPS Pager: [phone] 04/15/2013 6:10 PM

## 2013-04-15 NOTE — ED Notes (Signed)
Patient and patient's daughter report that the pateint has been having a productive cough with clear sputum. Patient c/o SOB and wheezing x 2 days. Patient used her albuterol nebulizer and inhaler with no relief.

## 2013-04-16 LAB — COMPREHENSIVE METABOLIC PANEL
AST: 26 U/L (ref 0–37)
BUN: 18 mg/dL (ref 6–23)
CO2: 22 mEq/L (ref 19–32)
Calcium: 9.2 mg/dL (ref 8.4–10.5)
Creatinine, Ser: 1.45 mg/dL — ABNORMAL HIGH (ref 0.50–1.10)
GFR calc Af Amer: 36 mL/min — ABNORMAL LOW (ref >90)
GFR calc non Af Amer: 31 mL/min — ABNORMAL LOW (ref >90)
Potassium: 4.2 mEq/L (ref 3.5–5.1)

## 2013-04-16 LAB — TSH: TSH: 1.112 u[IU]/mL (ref 0.350–4.500)

## 2013-04-16 LAB — LEGIONELLA ANTIGEN, URINE: Legionella Antigen, Urine: NEGATIVE

## 2013-04-16 LAB — STREP PNEUMONIAE URINARY ANTIGEN: Strep Pneumo Urinary Antigen: NEGATIVE

## 2013-04-16 LAB — CBC
HCT: 41.9 % (ref 36.0–46.0)
Hemoglobin: 14.2 g/dL (ref 12.0–15.0)
MCH: 30.7 pg (ref 26.0–34.0)
MCHC: 33.9 g/dL (ref 30.0–36.0)
RBC: 4.62 MIL/uL (ref 3.87–5.11)
WBC: 8.7 10*3/uL (ref 4.0–10.5)

## 2013-04-16 LAB — GLUCOSE, CAPILLARY

## 2013-04-16 LAB — INFLUENZA PANEL BY PCR (TYPE A & B): H1N1 flu by pcr: NOT DETECTED

## 2013-04-16 MED ORDER — METHYLPREDNISOLONE SODIUM SUCC 125 MG IJ SOLR
60.0000 mg | Freq: Four times a day (QID) | INTRAMUSCULAR | Status: DC
  Administered 2013-04-16 – 2013-04-18 (×8): 60 mg via INTRAVENOUS
  Filled 2013-04-16 (×12): qty 0.96

## 2013-04-16 MED ORDER — LEVOFLOXACIN IN D5W 750 MG/150ML IV SOLN
750.0000 mg | INTRAVENOUS | Status: AC
  Administered 2013-04-16 – 2013-04-18 (×2): 750 mg via INTRAVENOUS
  Filled 2013-04-16 (×2): qty 150

## 2013-04-16 MED ORDER — HYDRALAZINE HCL 20 MG/ML IJ SOLN
10.0000 mg | Freq: Four times a day (QID) | INTRAMUSCULAR | Status: DC | PRN
  Administered 2013-04-17 – 2013-04-19 (×5): 10 mg via INTRAVENOUS
  Filled 2013-04-16 (×7): qty 1

## 2013-04-16 MED ORDER — IPRATROPIUM BROMIDE 0.02 % IN SOLN: 0.5000 mg | RESPIRATORY_TRACT | Status: DC | PRN

## 2013-04-16 MED ORDER — ALBUTEROL SULFATE (5 MG/ML) 0.5% IN NEBU
2.5000 mg | INHALATION_SOLUTION | RESPIRATORY_TRACT | Status: AC | PRN
  Filled 2013-04-16: qty 0.5

## 2013-04-16 NOTE — Progress Notes (Addendum)
[facility] PROGRESS NOTE  Chianti Goh ZOX:[id number] DOB: 03-Jan-1922 DOA: 04/15/2013 PCP: No PCP Per Patient  Brief narrative: 79 yo female with metastatic breast cancer with metastasis to lungs and liver who presents to [facility] ED with main concern of several days duration of progressively worsening shortness of breath associated with productive cough of yellow sputum, subjective fevers, chills, malaise and poor oral intake. In ED, patient was noted to have mild dyspnea and with oxygen saturations in high 80's to low 90's on RA. She was placed on oxygen and was started on nebulizer tx, responded well. Chest x-ray showed no active cardiopulmonary disease. Lung perfusion scan showed low probability for pulmonary embolism.  Assessment and Plan:   Principal Problem:  Acute hypoxic respiratory failure  - Obvious concern is for pulmonary embolism. Lung perfusion scan done in the setting of renal failure, showed low probability for pulmonary embolism - Patient is on nebulizer treatments for COPD exacerbation. Using albuterol and Atrovent every 4 hours scheduled and every 2 hours as needed. Start Solu-Medrol 60 mg every 6 hours IV. - Oxygen support via nasal cannula to keep oxygen saturation above 90% - Start empiric antibiotics for possible pneumonia - Followup blood culture results - negative to date - Influenza negative. Legionella and strep pneumonia are still pending  Active Problems:  Acute COPD exacerbation - Management as above with nebulizer treatments, steroids and antibiotics CKD (chronic kidney disease), stage III  - creatine overall at baseline  HTN (hypertension)  - Continue propranolol 40 mg daily - Add hydralazine 10 mg IV every 6 hours as needed for systolic blood pressure above 811 CAD (coronary artery disease)  - clinically stable  - Continue Aspirin and Plavix   Code Status: full cdde Family Communication: no family at the bedside  Disposition Plan: home when  stable  Manson Passey, MD  [facility] Pager [phone]  If 7PM-7AM, please contact night-coverage [email or web address] Password [email or web address] 04/16/2013, 11:14 AM   LOS: 1 day   Consultants:  None   Procedures:  NM perfusion scan 04/15/2013 - low probability for PE  Antibiotics:  Levaquin 04/15/2013 -->  HPI/Subjective: Wheezing and in mild distress on ambulation.  Objective: Filed Vitals:   04/16/13 0400 04/16/13 0455 04/16/13 0731 04/16/13 1007  BP:  149/73  152/62  Pulse:  77  87  Temp:  98.1 F (36.7 C)    TempSrc:  Oral    Resp:  20 18   Height:      Weight:  65.2 kg (143 lb 11.8 oz)    SpO2: 98% 99%      Intake/Output Summary (Last 24 hours) at 04/16/13 1114 Last data filed at 04/16/13 1007  Gross per 24 hour  Intake   1280 ml  Output   1150 ml  Net    130 ml    Exam:   General:  Pt is alert, follows commands appropriately, not in acute distress  Cardiovascular: Regular rate and rhythm, S1/S2, no murmurs, no rubs, no gallops  Respiratory: wheezing audible with rhonchi in upper lobes  Abdomen: Soft, non tender, non distended, bowel sounds present, no guarding  Extremities: No edema, pulses DP and PT palpable bilaterally  Neuro: Grossly nonfocal  Data Reviewed: Basic Metabolic Panel:  Recent Labs Lab 04/15/13 1226 04/15/13 2024 04/16/13 0347  NA 134* 136 135  K 3.7 3.7 4.2  CL 98 101 103  CO2 24 20 22   GLUCOSE 97 282* 144*  BUN 21 20 18   CREATININE 1.52* 1.44* 1.45*  CALCIUM 10.0 9.8 9.2  MG  --  2.0  --   PHOS  --  2.7  --    Liver Function Tests:  Recent Labs Lab 04/15/13 1226 04/15/13 2024 04/16/13 0347  AST 27 28 26   ALT 20 21 20   ALKPHOS 82 86 85  BILITOT 0.6 0.5 0.4  PROT 7.8 8.0 7.5  ALBUMIN 4.2 3.9 3.7   No results found for this basename: LIPASE, AMYLASE,  in the last 168 hours No results found for this basename: AMMONIA,  in the last 168 hours CBC:  Recent Labs Lab 04/15/13 1226 04/15/13 2024 04/16/13 0347   WBC 7.9 6.3 8.7  NEUTROABS 4.5 4.8  --   HGB 14.8 15.0 14.2  HCT 42.9 44.3 41.9  MCV 89.7 89.7 90.7  PLT 230 244 233   Cardiac Enzymes:  Recent Labs Lab 04/15/13 1226  TROPONINI <0.30   BNP: No components found with this basename: POCBNP,  CBG:  Recent Labs Lab 04/16/13 0744  GLUCAP 118*    CULTURE, BLOOD (ROUTINE X 2)     Status: None   Collection Time    04/15/13  7:00 PM      Result Value Range Status   Specimen Description BLOOD RIGHT HAND   Final   Special Requests BOTTLES DRAWN AEROBIC AND ANAEROBIC 10CC   Final   Culture  Setup Time     Final   Value: 04/15/2013 23:19     Performed at [facility]   Culture     Final   Value:        BLOOD CULTURE RECEIVED NO GROWTH TO DATE     Performed at [facility]   Report Status PENDING   Incomplete  CULTURE, BLOOD (ROUTINE X 2)     Status: None   Collection Time    04/15/13  7:00 PM      Result Value Range Status   Specimen Description BLOOD RIGHT HAND   Final   Special Requests BOTTLES DRAWN AEROBIC AND ANAEROBIC 10CC   Final   Culture  Setup Time     Final   Value: 04/15/2013 23:20     Performed at [facility]   Culture     Final   Value:        BLOOD CULTURE RECEIVED NO GROWTH TO DATE      Performed at [facility]   Report Status PENDING   Incomplete     Studies: Dg Neck Soft Tissue 04/15/2013     IMPRESSION: No focal soft tissue abnormality detected by conventional radiography.  Atherosclerosis including left carotid artery and aortic arch disease.   Electronically Signed   By: Malachy Moan M.D.   On: 04/15/2013 13:31   Dg Chest 2 View 04/15/2013    IMPRESSION: No active cardiopulmonary disease.   Electronically Signed   By: Genevive Bi M.D.   On: 04/15/2013 13:06   Nm Pulmonary Perf And Vent 04/15/2013  IMPRESSION: No ventilation or perfusion defects. Very low probability of pulmonary embolus.   Electronically Signed   By: Bretta Bang M.D.   On:  04/15/2013 15:14    Scheduled Meds: . albuterol  2.5 mg Nebulization Q4H  . aspirin  325 mg Oral Daily  . cholecalciferol  2,000 Units Oral Daily  . clopidogrel  75 mg Oral Daily  . ezetimibe  10 mg Oral Daily  . ipratropium  0.5 mg Nebulization Q4H  . letrozole  2.5 mg Oral Daily  .  levofloxacin (LEVAQUIN) IV  750 mg Intravenous Q48H  . methylPREDNISolone (SOLU-MEDROL) injection  60 mg Intravenous Q6H  . propranolol  40 mg Oral Daily  . simvastatin  40 mg Oral q1800  . sodium bicarbonate  650 mg Oral BID

## 2013-04-16 NOTE — Progress Notes (Signed)
Nutrition Consult Note  Wt Readings from Last 15 Encounters:  04/16/13 143 lb 11.8 oz (65.2 kg)  03/10/13 142 lb 3.2 oz (64.501 kg)    Body mass index is 25.47 kg/(m^2). Patient meets criteria for overweight based on current BMI.   Current diet order is regular, patient is consuming approximately 50% of meals at this time. Labs and medications reviewed. Pt with metastatic breast CA admitted with several days of shortness of breath. Met with pt and family who report pt eating well PTA, 2 meals/day with stable weight. Pt denies being on any nutritional supplements at home or having any problems swallowing. Pt reports having good appetite.   No nutrition interventions warranted at this time. If nutrition issues arise, please consult RD.   Levon Hedger MS, RD, LDN [phone] Pager [phone] After Hours Pager

## 2013-04-16 NOTE — Evaluation (Signed)
Occupational Therapy Evaluation Patient Details Name: Danielle Bullock MRN: [id number] DOB: 05-24-1922 Today's Date: 04/16/2013 Time: 4098-1191 OT Time Calculation (min): 23 min  OT Assessment / Plan / Recommendation History of present illness 79 yo female admitted with COPD exacerbation. Hx of breast cancer with mets to lung, liver.    Clinical Impression   Pt was admitted with COPD exacerbation.  At baseline, she is mod I with all adls.  She is appropriate for skilled OT with goals of mod I in acute and emphasizing safety, energy conservation and pursed lip breathing.      OT Assessment  Patient needs continued OT Services    Follow Up Recommendations  No OT follow up;Supervision/Assistance - 24 hour (likely)    Barriers to Discharge      Equipment Recommendations       Recommendations for Other Services    Frequency  Min 2X/week    Precautions / Restrictions Precautions Precautions: None Restrictions Weight Bearing Restrictions: No   Pertinent Vitals/Pain No pain.  Dyspnea 3/4 with minimal activity.  Sats 100% on 2 liters    ADL  Toilet Transfer: Simulated;Min Pension scheme manager Method: Sit to stand Equipment Used:  (used IV pole to ambulate towards sink) Transfers/Ambulation Related to ADLs: Began to ambulate towards sink; walked about 6 feet total and stopped to return to bed as pt with 3/4 dyspnea.  sats remained 100% on 2 liters 02.   ADL Comments: Pt  can reach to feet crossing legs for adls but is overall min A for all adls due to dyspnea (3/4).  Pt wears slip on shoes and sits on tub bench for shower.  Reviewed energy conservation, emphasizing rest breaks.  Educated to perform pursed lip breathing.  Pt return demonstrates this, but it takes several minutes for her to recover.  Provided energy conservation handout and reviewed this with her.      OT Diagnosis: Generalized weakness  OT Problem List: Decreased strength;Decreased activity tolerance;Impaired balance  (sitting and/or standing);Cardiopulmonary status limiting activity OT Treatment Interventions: Self-care/ADL training;DME and/or AE instruction;Balance training;Patient/family education;Energy conservation   OT Goals(Current goals can be found in the care plan section) Acute Rehab OT Goals Patient Stated Goal: breathe better OT Goal Formulation: With patient Time For Goal Achievement: 04/30/13 Potential to Achieve Goals: Good ADL Goals Pt Will Transfer to Toilet: with modified independence;ambulating;regular height toilet Pt Will Perform Toileting - Clothing Manipulation and hygiene: with modified independence;sit to/from stand Pt Will Perform Tub/Shower Transfer: with supervision;ambulating;shower seat;Tub transfer Additional ADL Goal #1: Pt will initiate at least one rest break per session for adls Additional ADL Goal #2: Pt will initiate and demonstrate pursed lip breathing independently when dyspnic  Visit Information  Last OT Received On: 04/16/13 Assistance Needed: +1 History of Present Illness: 79 yo female admitted with COPD exacerbation. Hx of breast cancer with mets to lung, liver.        Prior Functioning     Home Living Family/patient expects to be discharged to:: Private residence Living Arrangements: Children Available Help at Discharge: Available 24 hours/day Type of Home: House Home Access: Stairs to enter [employer] of Steps: 4 Entrance Stairs-Rails: Right Home Layout: One level Home Equipment: Cane - single point;Shower seat Additional Comments: uses cane inconsistently.  Has fallen inside once within 6 months.    At baseline, very independent (mod I) and drives.  Daughter cooks for her.  Family will stay 24/7 when she leaves Prior Function Level of Independence: Independent with assistive device(s) Communication  Communication: No difficulties         Vision/Perception     Cognition  Cognition Arousal/Alertness: Awake/alert Behavior  During Therapy: WFL for tasks assessed/performed Overall Cognitive Status: Within Functional Limits for tasks assessed    Extremity/Trunk Assessment Upper Extremity Assessment Upper Extremity Assessment: Generalized weakness Lower Extremity Assessment Lower Extremity Assessment: Generalized weakness Cervical / Trunk Assessment Cervical / Trunk Assessment: Normal     Mobility Bed Mobility Bed Mobility: Supine to Sit Supine to Sit: 5: Supervision;HOB elevated;With rails Details for Bed Mobility Assistance: pt in bathroom.  Transfers Sit to Stand: 5: Supervision;From bed;With upper extremity assist Stand to Sit: 5: Supervision;To bed     Exercise     Balance     End of Session OT - End of Session Activity Tolerance:  (limited by work of breathing) Patient left: in bed;with call bell/phone within reach;with family/visitor present  GO     Danielle Bullock 04/16/2013, 2:58 PM Danielle Bullock, OTR/L [phone] 04/16/2013

## 2013-04-16 NOTE — Evaluation (Signed)
Physical Therapy Evaluation Patient Details Name: Danielle Bullock MRN: [id number] DOB: [birth year] Today's Date: 04/16/2013 Time: 1210-1222 PT Time Calculation (min): 12 min  PT Assessment / Plan / Recommendation History of Present Illness  79 yo female admitted with COPD exacerbation. Hx of breast cancer with mets to lung, liver.   Clinical Impression  On eval, pt required supervision level assist for mobility-able to ambulate ~15 feet in room without assistive device. Pt states she normally uses cane for ambulation outside of home. Distance limited by wheezing, dyspnea. Anticipate pt should progress well with activity tolerance. Will follow during hospital stay.     PT Assessment  Patient needs continued PT services    Follow Up Recommendations  No PT follow up;Supervision - Intermittent    Does the patient have the potential to tolerate intense rehabilitation      Barriers to Discharge        Equipment Recommendations  None recommended by PT    Recommendations for Other Services     Frequency Min 3X/week    Precautions / Restrictions Precautions Precautions: None Restrictions Weight Bearing Restrictions: No   Pertinent Vitals/Pain Abdomen/chest "from coughing"-unrated      Mobility  Bed Mobility Bed Mobility: Not assessed Details for Bed Mobility Assistance: pt in bathroom.  Transfers Transfers: Sit to Stand;Stand to Sit Sit to Stand: 5: Supervision;From toilet Stand to Sit: 5: Supervision;To chair/3-in-1 Ambulation/Gait Ambulation/Gait Assistance: 4: Min guard Ambulation Distance (Feet): 15 Feet Assistive device: None Ambulation/Gait Assistance Details: Significant wheezing noted with short ambulation distance. Dyspnea 3/4. Deferred further ambulation at this time. NO LOB.  Gait Pattern: Step-through pattern    Exercises     PT Diagnosis: Difficulty walking  PT Problem List: Decreased mobility;Decreased activity tolerance;Cardiopulmonary status limiting  activity PT Treatment Interventions: Gait training;Functional mobility training;Stair training;Therapeutic activities;Therapeutic exercise;Patient/family education     PT Goals(Current goals can be found in the care plan section) Acute Rehab PT Goals Patient Stated Goal: breathe better PT Goal Formulation: With patient Time For Goal Achievement: 04/30/13 Potential to Achieve Goals: Good  Visit Information  Last PT Received On: 04/16/13 Assistance Needed: +1 History of Present Illness: 79 yo female admitted with COPD exacerbation. Hx of breast cancer with mets to lung, liver.        Prior Functioning  Home Living Family/patient expects to be discharged to:: Private residence (daughters home) Living Arrangements: Children Available Help at Discharge: Available 24 hours/day Type of Home: House Home Access: Stairs to enter Secretary/administrator of Steps: 4 Entrance Stairs-Rails: Right Home Layout: One level Home Equipment: Cane - single point Prior Function Level of Independence: Independent with assistive device(s) Communication Communication: No difficulties    Cognition  Cognition Arousal/Alertness: Awake/alert Behavior During Therapy: WFL for tasks assessed/performed Overall Cognitive Status: Within Functional Limits for tasks assessed    Extremity/Trunk Assessment Upper Extremity Assessment Upper Extremity Assessment: Generalized weakness Lower Extremity Assessment Lower Extremity Assessment: Generalized weakness Cervical / Trunk Assessment Cervical / Trunk Assessment: Normal   Balance    End of Session PT - End of Session Activity Tolerance: Patient limited by fatigue (Limited by wheezing, SOB) Patient left: in chair;with call bell/phone within reach  GP     Rebeca Alert, MPT Pager: [phone]

## 2013-04-16 NOTE — Care Management Note (Addendum)
Cm spoke with patient and adult daughter at bedside concerning discharge planning. Pt recently relocated to [place] from [place]. No PCP record. Cm provided pt with information concerning PCP in community. Pt provided CM permission to schedule an appt at [facility]. Appt scheduled for April 30, 2013 @ 11am with Dr.Ghirmire. Cm informed [facility] that patient will require referrals to a Pulmonologist and Cardiologist. Per pt uses a cane and rw at home. Pt's daughter requesting a 3 pronged cane. PT/Ot eval ordered.   Roxy Manns Kymberly Blomberg,RN,MSN [phone]

## 2013-04-16 NOTE — Progress Notes (Signed)
Instructed pt & family members on use of nebulizers & MDI w/ spacer.  Pt. Demonstrated well & verbalized understanding as well as family members (2 daughters at bedside).  Also reviewed importance of cleaning/disinfecting HHN w/ vinegar & water and changing equipment regularly.

## 2013-04-17 LAB — GLUCOSE, CAPILLARY: Glucose-Capillary: 109 mg/dL — ABNORMAL HIGH (ref 70–99)

## 2013-04-17 MED ORDER — HYDRALAZINE HCL 20 MG/ML IJ SOLN
10.0000 mg | Freq: Once | INTRAMUSCULAR | Status: AC
  Administered 2013-04-17: 10 mg via INTRAVENOUS

## 2013-04-17 MED ORDER — ALBUTEROL SULFATE (5 MG/ML) 0.5% IN NEBU
2.5000 mg | INHALATION_SOLUTION | Freq: Four times a day (QID) | RESPIRATORY_TRACT | Status: DC
  Administered 2013-04-17 – 2013-04-19 (×7): 2.5 mg via RESPIRATORY_TRACT
  Filled 2013-04-17 (×6): qty 0.5

## 2013-04-17 MED ORDER — ALBUTEROL SULFATE (5 MG/ML) 0.5% IN NEBU
2.5000 mg | INHALATION_SOLUTION | RESPIRATORY_TRACT | Status: DC
  Administered 2013-04-17: 2.5 mg via RESPIRATORY_TRACT
  Filled 2013-04-17 (×2): qty 0.5

## 2013-04-17 MED ORDER — ALBUTEROL SULFATE (5 MG/ML) 0.5% IN NEBU
2.5000 mg | INHALATION_SOLUTION | RESPIRATORY_TRACT | Status: DC | PRN
  Administered 2013-04-18: 2.5 mg via RESPIRATORY_TRACT
  Filled 2013-04-17: qty 0.5

## 2013-04-17 MED ORDER — IPRATROPIUM BROMIDE 0.02 % IN SOLN
0.5000 mg | Freq: Four times a day (QID) | RESPIRATORY_TRACT | Status: DC
  Administered 2013-04-17: 0.5 mg via RESPIRATORY_TRACT
  Filled 2013-04-17 (×2): qty 2.5

## 2013-04-17 MED ORDER — IPRATROPIUM BROMIDE 0.02 % IN SOLN
0.5000 mg | Freq: Four times a day (QID) | RESPIRATORY_TRACT | Status: DC
  Administered 2013-04-17 – 2013-04-19 (×7): 0.5 mg via RESPIRATORY_TRACT
  Filled 2013-04-17 (×6): qty 2.5

## 2013-04-17 MED ORDER — IPRATROPIUM BROMIDE 0.02 % IN SOLN: 0.5000 mg | Freq: Four times a day (QID) | RESPIRATORY_TRACT | Status: DC | PRN

## 2013-04-17 NOTE — Progress Notes (Signed)
[facility] PROGRESS NOTE  Danielle Bullock:[id number] DOB: 02-29-1922 DOA: 04/15/2013 PCP: No PCP Per Patient  Brief narrative: 79 yo female with metastatic breast cancer with metastasis to lungs and liver who presents to [facility] ED with main concern of several days duration of progressively worsening shortness of breath associated with productive cough of yellow sputum, subjective fevers, chills, malaise and poor oral intake. In ED, patient was noted to have mild dyspnea and with oxygen saturations in high 80's to low 90's on RA. She was placed on oxygen and was started on nebulizer tx, responded well. Chest x-ray showed no active cardiopulmonary disease. Lung perfusion scan showed low probability for pulmonary embolism.   Assessment and Plan:   Principal Problem:  Acute hypoxic respiratory failure  - initial concern was for pulmonary embolism. Lung perfusion scan done in the setting of renal failure, showed low probability for pulmonary embolism  - Patient is on nebulizer treatments for COPD exacerbation. Using albuterol and Atrovent every 4 hours scheduled and every 2 hours as needed. She still needs same regimen. Continue Solu-Medrol 60 mg every 6 hours IV.  - Oxygen support via nasal cannula to keep oxygen saturation above 90%  - Continue Levaquin for possible pneumonia  - Followup blood culture results - negative to date  - Influenza negative. Legionella and strep pneumonia results negative.  Active Problems:  Acute COPD exacerbation  - Management as above with nebulizer treatments, steroids and antibiotics  - will taper steroids tomorrow; pt still wheezing CKD (chronic kidney disease), stage III  - creatine overall at baseline  HTN (hypertension)  - Continue propranolol 40 mg daily  - may use hydralazine 10 mg IV every 6 hours as needed for systolic blood pressure above 440  CAD (coronary artery disease)  - clinically stable  - Continue Aspirin and Plavix   Code Status: full  cdde  Family Communication: no family at the bedside  Disposition Plan: home when stable   Manson Passey, MD  [facility]  Pager [phone]   Consultants:  None  Procedures:  NM perfusion scan 04/15/2013 - low probability for PE Antibiotics:  Levaquin 04/15/2013 -->   If 7PM-7AM, please contact night-coverage [email or web address] Password [email or web address] 04/17/2013, 7:12 AM   LOS: 2 days    HPI/Subjective: No overnight events.  Objective: Filed Vitals:   04/16/13 2138 04/17/13 0002 04/17/13 0329 04/17/13 0505  BP: 150/69   164/84  Pulse: 79   84  Temp: 97.5 F (36.4 C)   97.7 F (36.5 C)  TempSrc: Oral   Oral  Resp: 20   20  Height:      Weight:      SpO2: 100% 100% 97% 100%    Intake/Output Summary (Last 24 hours) at 04/17/13 6644 Last data filed at 04/17/13 0317  Gross per 24 hour  Intake    240 ml  Output   1750 ml  Net  -1510 ml    Exam:   General:  Pt is alert, follows commands appropriately, not in acute distress  Cardiovascular: Regular rate and rhythm, S1/S2, no murmurs, no rubs, no gallops  Respiratory: still wheezing in upper lung lobes, no crackles  Abdomen: Soft, non tender, non distended, bowel sounds present, no guarding  Extremities: No edema, pulses DP and PT palpable bilaterally  Neuro: Grossly nonfocal  Data Reviewed: Basic Metabolic Panel:  Recent Labs Lab 04/15/13 1226 04/15/13 2024 04/16/13 0347  NA 134* 136 135  K 3.7 3.7 4.2  CL 98 101 103  CO2 24 20 22   GLUCOSE 97 282* 144*  BUN 21 20 18   CREATININE 1.52* 1.44* 1.45*  CALCIUM 10.0 9.8 9.2  MG  --  2.0  --   PHOS  --  2.7  --    Liver Function Tests:  Recent Labs Lab 04/15/13 1226 04/15/13 2024 04/16/13 0347  AST 27 28 26   ALT 20 21 20   ALKPHOS 82 86 85  BILITOT 0.6 0.5 0.4  PROT 7.8 8.0 7.5  ALBUMIN 4.2 3.9 3.7   No results found for this basename: LIPASE, AMYLASE,  in the last 168 hours No results found for this basename: AMMONIA,  in the last 168  hours CBC:  Recent Labs Lab 04/15/13 1226 04/15/13 2024 04/16/13 0347  WBC 7.9 6.3 8.7  NEUTROABS 4.5 4.8  --   HGB 14.8 15.0 14.2  HCT 42.9 44.3 41.9  MCV 89.7 89.7 90.7  PLT 230 244 233   Cardiac Enzymes:  Recent Labs Lab 04/15/13 1226  TROPONINI <0.30   BNP: No components found with this basename: POCBNP,  CBG:  Recent Labs Lab 04/16/13 0744  GLUCAP 118*    Recent Results (from the past 240 hour(s))  CULTURE, BLOOD (ROUTINE X 2)     Status: None   Collection Time    04/15/13  7:00 PM      Result Value Range Status   Specimen Description BLOOD RIGHT HAND   Final   Special Requests BOTTLES DRAWN AEROBIC AND ANAEROBIC 10CC   Final   Culture  Setup Time     Final   Value: 04/15/2013 23:19     Performed at [facility]   Culture     Final   Value:        BLOOD CULTURE RECEIVED NO GROWTH TO DATE CULTURE WILL BE HELD FOR 5 DAYS BEFORE ISSUING A FINAL NEGATIVE REPORT     Performed at [facility]   Report Status PENDING   Incomplete  CULTURE, BLOOD (ROUTINE X 2)     Status: None   Collection Time    04/15/13  7:00 PM      Result Value Range Status   Specimen Description BLOOD RIGHT HAND   Final   Special Requests BOTTLES DRAWN AEROBIC AND ANAEROBIC 10CC   Final   Culture  Setup Time     Final   Value: 04/15/2013 23:20     Performed at [facility]   Culture     Final   Value:        BLOOD CULTURE RECEIVED NO GROWTH TO DATE CULTURE WILL BE HELD FOR 5 DAYS BEFORE ISSUING A FINAL NEGATIVE REPORT     Performed at [facility]   Report Status PENDING   Incomplete     Studies: Dg Neck Soft Tissue  04/15/2013   CLINICAL DATA:  Short of breath, wheezing, hemoptysis to  EXAM: NECK SOFT TISSUES - 1+ VIEW  COMPARISON:  Concurrently obtained chest x-ray  FINDINGS: There is no evidence of retropharyngeal soft tissue swelling or epiglottic enlargement. The cervical airway is unremarkable and no radio-opaque foreign body identified.  Electronic device is in the bilateral external auditory canals are consistent with a hearing aids. Atherosclerotic plaque is noted within the left common carotid artery. Mild multilevel cervical spondylosis without focality. No acute fracture identified. Incompletely imaged median sternotomy. Atherosclerotic calcifications are noted in the transverse aorta. The visualized lung apices appear clear.  IMPRESSION: No focal soft tissue abnormality detected by conventional  radiography.  Atherosclerosis including left carotid artery and aortic arch disease.   Electronically Signed   By: Malachy Moan M.D.   On: 04/15/2013 13:31   Dg Chest 2 View  04/15/2013   CLINICAL DATA:  Short of breath, cough  EXAM: CHEST  2 VIEW  COMPARISON:  None.  FINDINGS: Sternotomy wires overlie normal cardiac silhouette. No effusion, infiltrate, or pneumothorax. Degenerative osteophytosis of the thoracic spine. Left lymphadenectomy clips noted.  IMPRESSION: No active cardiopulmonary disease.   Electronically Signed   By: Genevive Bi M.D.   On: 04/15/2013 13:06   Nm Pulmonary Perf And Vent  04/15/2013   CLINICAL DATA:  Shortness of Breath  EXAM: NUCLEAR MEDICINE VENTILATION - PERFUSION LUNG SCAN  Views: Anterior, posterior, left lateral, right lateral, RPO, LPO, RAO, LAO -ventilation and perfusion  Radionuclide: Technetium 16m DTPA -ventilation; Technetium 77m macroaggregated albumin-perfusion  Dose: 43.9 mCi-ventilation; 5.7 mCi-perfusion  Route of administration: Inhalation -ventilation; intravenous -perfusion  COMPARISON:  Chest radiograph April 15, 2013  FINDINGS: The ventilation study shows homogeneous and symmetric uptake of radiotracer bilaterally.  The perfusion study shows homogeneous and symmetric radiotracer uptake bilaterally.  There is no appreciable ventilation/ perfusion mismatch.  IMPRESSION: No ventilation or perfusion defects. Very low probability of pulmonary embolus.   Electronically Signed   By: Bretta Bang M.D.   On: 04/15/2013 15:14    Scheduled Meds: . albuterol  2.5 mg Nebulization Q4H WA  . aspirin  325 mg Oral Daily  . cholecalciferol  2,000 Units Oral Daily  . clopidogrel  75 mg Oral Daily  . ezetimibe  10 mg Oral Daily  . ipratropium  0.5 mg Nebulization QID  . letrozole  2.5 mg Oral Daily  . levofloxacin (LEVAQUIN)   750 mg Intravenous Q48H  . methylPREDNISolone  60 mg Intravenous Q6H  . propranolol  40 mg Oral Daily  . simvastatin  40 mg Oral q1800  . sodium bicarbonate  650 mg Oral BID

## 2013-04-18 MED ORDER — FUROSEMIDE 40 MG PO TABS
40.0000 mg | ORAL_TABLET | Freq: Every day | ORAL | Status: DC
  Administered 2013-04-18 – 2013-04-19 (×2): 40 mg via ORAL
  Filled 2013-04-18 (×2): qty 1

## 2013-04-18 MED ORDER — METHYLPREDNISOLONE SODIUM SUCC 125 MG IJ SOLR
60.0000 mg | INTRAMUSCULAR | Status: DC
  Administered 2013-04-19: 60 mg via INTRAVENOUS
  Filled 2013-04-18 (×2): qty 0.96

## 2013-04-18 MED ORDER — POTASSIUM CHLORIDE CRYS ER 20 MEQ PO TBCR
20.0000 meq | EXTENDED_RELEASE_TABLET | Freq: Every day | ORAL | Status: DC
  Administered 2013-04-18 – 2013-04-19 (×2): 20 meq via ORAL
  Filled 2013-04-18 (×2): qty 1

## 2013-04-18 NOTE — Progress Notes (Signed)
[facility] PROGRESS NOTE  Kaizley Aja ZOX:[id number] DOB: Oct 18, 1921 DOA: 04/15/2013 PCP: No PCP Per Patient  Brief narrative: 79 yo female with metastatic breast cancer with metastasis to lungs and liver who presents to [facility] ED with main concern of several days duration of progressively worsening shortness of breath associated with productive cough of yellow sputum, subjective fevers, chills, malaise and poor oral intake. In ED, patient was noted to have mild dyspnea and with oxygen saturations in high 80's to low 90's on RA. She was placed on oxygen and was started on nebulizer tx, responded well. Chest x-ray showed no active cardiopulmonary disease. Lung perfusion scan showed low probability for pulmonary embolism.   Assessment and Plan:   Principal Problem:  Acute hypoxic respiratory failure  - initial concern was for pulmonary embolism. Lung perfusion scan done in the setting of renal failure, showed low probability for pulmonary embolism  - Patient is on nebulizer treatments for COPD exacerbation. Using albuterol and Atrovent every 4 hours scheduled and every 2 hours as needed.  - Continue Solu-Medrol 60 mg every 24 hours IV.  - Oxygen support via nasal cannula to keep oxygen saturation above 90%  - Continue Levaquin for pneumonia  - Followup blood culture results - negative to date  - Influenza negative. Legionella and strep pneumonia results negative.  Active Problems:  Acute COPD exacerbation  - Management as above with nebulizer treatments, steroids and antibiotics  - decrease solumedrol to once a day regimen CKD (chronic kidney disease), stage III  - creatine overall at baseline but may be increased due to lasix and benazepril pt is taking at home - both meds held. Will restart lasix today and monitor renal function HTN (hypertension)  - Continue propranolol 40 mg daily  - may use hydralazine 10 mg IV every 6 hours as needed for systolic blood pressure above 811  CAD  (coronary artery disease)  - clinically stable  - Continue Aspirin and Plavix   Code Status: full cdde  Family Communication: no family at the bedside  Disposition Plan: home when stable   Manson Passey, MD  [facility]  Pager [phone]   Consultants:  None  Procedures:  NM perfusion scan 04/15/2013 - low probability for PE Antibiotics:  Levaquin 04/15/2013 -->   If 7PM-7AM, please contact night-coverage [email or web address] Password [email or web address] 04/18/2013, 7:10 AM   LOS: 3 days   HPI/Subjective: No acute overnight events.  Objective: Filed Vitals:   04/17/13 1812 04/17/13 2138 04/18/13 0441 04/18/13 0556  BP: 159/61 165/73 147/68   Pulse: 82 81 81   Temp:  97.5 F (36.4 C) 97.9 F (36.6 C)   TempSrc:  Oral Oral   Resp:  20 18   Height:      Weight:    70.4 kg (155 lb 3.3 oz)  SpO2:  100% 99%     Intake/Output Summary (Last 24 hours) at 04/18/13 0710 Last data filed at 04/18/13 0557  Gross per 24 hour  Intake    480 ml  Output    650 ml  Net   -170 ml    Exam:   General:  Pt is alert, follows commands appropriately, not in acute distress  Cardiovascular: Regular rate and rhythm, S1/S2, no murmurs, no rubs, no gallops  Respiratory: wheezing in upper lung lobes, no rhonchi  Abdomen: Soft, non tender, non distended, bowel sounds present, no guarding  Extremities: No edema, pulses DP and PT palpable bilaterally  Neuro: Grossly nonfocal  Data Reviewed: Basic Metabolic  Panel:  Recent Labs Lab 04/15/13 1226 04/15/13 2024 04/16/13 0347  NA 134* 136 135  K 3.7 3.7 4.2  CL 98 101 103  CO2 24 20 22   GLUCOSE 97 282* 144*  BUN 21 20 18   CREATININE 1.52* 1.44* 1.45*  CALCIUM 10.0 9.8 9.2  MG  --  2.0  --   PHOS  --  2.7  --    Liver Function Tests:  Recent Labs Lab 04/15/13 1226 04/15/13 2024 04/16/13 0347  AST 27 28 26   ALT 20 21 20   ALKPHOS 82 86 85  BILITOT 0.6 0.5 0.4  PROT 7.8 8.0 7.5  ALBUMIN 4.2 3.9 3.7   No results found for this  basename: LIPASE, AMYLASE,  in the last 168 hours No results found for this basename: AMMONIA,  in the last 168 hours CBC:  Recent Labs Lab 04/15/13 1226 04/15/13 2024 04/16/13 0347  WBC 7.9 6.3 8.7  NEUTROABS 4.5 4.8  --   HGB 14.8 15.0 14.2  HCT 42.9 44.3 41.9  MCV 89.7 89.7 90.7  PLT 230 244 233   Cardiac Enzymes:  Recent Labs Lab 04/15/13 1226  TROPONINI <0.30   BNP: No components found with this basename: POCBNP,  CBG:  Recent Labs Lab 04/16/13 0744 04/17/13 0740  GLUCAP 118* 109*    Recent Results (from the past 240 hour(s))  CULTURE, BLOOD (ROUTINE X 2)     Status: None   Collection Time    04/15/13  7:00 PM      Result Value Range Status   Specimen Description BLOOD RIGHT HAND   Final   Special Requests BOTTLES DRAWN AEROBIC AND ANAEROBIC 10CC   Final   Culture  Setup Time     Final   Value: 04/15/2013 23:19     Performed at [facility]   Culture     Final   Value:        BLOOD CULTURE RECEIVED NO GROWTH TO DATE CULTURE WILL BE HELD FOR 5 DAYS BEFORE ISSUING A FINAL NEGATIVE REPORT     Performed at [facility]   Report Status PENDING   Incomplete  CULTURE, BLOOD (ROUTINE X 2)     Status: None   Collection Time    04/15/13  7:00 PM      Result Value Range Status   Specimen Description BLOOD RIGHT HAND   Final   Special Requests BOTTLES DRAWN AEROBIC AND ANAEROBIC 10CC   Final   Culture  Setup Time     Final   Value: 04/15/2013 23:20     Performed at [facility]   Culture     Final   Value:        BLOOD CULTURE RECEIVED NO GROWTH TO DATE CULTURE WILL BE HELD FOR 5 DAYS BEFORE ISSUING A FINAL NEGATIVE REPORT     Performed at [facility]   Report Status PENDING   Incomplete     Studies: No results found.  Scheduled Meds: . albuterol  2.5 mg Nebulization Q6H WA  . aspirin  325 mg Oral Daily  . cholecalciferol  2,000 Units Oral Daily  . clopidogrel  75 mg Oral Daily  . ezetimibe  10 mg Oral Daily  .  furosemide  40 mg Oral Daily  . ipratropium  0.5 mg Nebulization Q6H WA  . letrozole  2.5 mg Oral Daily  . levofloxacin (LEVAQUIN) IV  750 mg Intravenous Q48H  . methylPREDNISolone (SOLU-MEDROL) injection  60 mg Intravenous  Q6H  . potassium chloride SA  20 mEq Oral Daily  . propranolol  40 mg Oral Daily  . simvastatin  40 mg Oral q1800  . sodium bicarbonate  650 mg Oral BID   Continuous Infusions:

## 2013-04-18 NOTE — Progress Notes (Signed)
CSW received consult for COPD Gold Protocol -   CSW administered Depression Scale (patient scored 4/27) & Generalized Anxiety Disorder scale (patient scored 1/21).   Screening forms placed on shadow chart.   Provided Pt with Advanced Directives booklet.  Pt stated that she did not request this booklet but that she will accept it and see if her daughter would like to review it.  No further needs identified.  Providence Crosby, LCSWA Clinical Social Work [phone]

## 2013-04-19 LAB — CBC
HCT: 44.8 % (ref 36.0–46.0)
Hemoglobin: 14.9 g/dL (ref 12.0–15.0)
MCHC: 33.3 g/dL (ref 30.0–36.0)
MCV: 90.1 fL (ref 78.0–100.0)

## 2013-04-19 LAB — BASIC METABOLIC PANEL
BUN: 24 mg/dL — ABNORMAL HIGH (ref 6–23)
CO2: 23 mEq/L (ref 19–32)
Chloride: 104 mEq/L (ref 96–112)
Creatinine, Ser: 1.29 mg/dL — ABNORMAL HIGH (ref 0.50–1.10)
GFR calc Af Amer: 41 mL/min — ABNORMAL LOW (ref >90)
GFR calc non Af Amer: 36 mL/min — ABNORMAL LOW (ref >90)
Glucose, Bld: 120 mg/dL — ABNORMAL HIGH (ref 70–99)
Potassium: 3.4 mEq/L — ABNORMAL LOW (ref 3.5–5.1)

## 2013-04-19 MED ORDER — PROPRANOLOL HCL 60 MG PO TABS: 60.0000 mg | ORAL_TABLET | Freq: Every day | ORAL | Status: DC

## 2013-04-19 MED ORDER — BUDESONIDE 0.5 MG/2ML IN SUSP: 0.5000 mg | Freq: Two times a day (BID) | RESPIRATORY_TRACT | Status: DC

## 2013-04-19 MED ORDER — ALBUTEROL SULFATE (5 MG/ML) 0.5% IN NEBU: 2.5000 mg | INHALATION_SOLUTION | Freq: Four times a day (QID) | RESPIRATORY_TRACT | Status: DC | PRN

## 2013-04-19 MED ORDER — EZETIMIBE 10 MG PO TABS: 10.0000 mg | ORAL_TABLET | Freq: Every day | ORAL | Status: DC

## 2013-04-19 MED ORDER — CLOPIDOGREL BISULFATE 75 MG PO TABS: 75.0000 mg | ORAL_TABLET | Freq: Every day | ORAL | Status: DC

## 2013-04-19 MED ORDER — FUROSEMIDE 40 MG PO TABS: 40.0000 mg | ORAL_TABLET | Freq: Every day | ORAL | Status: DC

## 2013-04-19 MED ORDER — IPRATROPIUM BROMIDE 0.02 % IN SOLN: 0.5000 mg | Freq: Four times a day (QID) | RESPIRATORY_TRACT | Status: DC | PRN

## 2013-04-19 MED ORDER — ALBUTEROL SULFATE HFA 108 (90 BASE) MCG/ACT IN AERS: 2.0000 | INHALATION_SPRAY | RESPIRATORY_TRACT | Status: DC | PRN

## 2013-04-19 MED ORDER — HYDROCODONE-ACETAMINOPHEN 5-325 MG PO TABS: 1.0000 | ORAL_TABLET | ORAL | Status: DC | PRN

## 2013-04-19 MED ORDER — PREDNISONE 5 MG PO TABS: 5.0000 mg | ORAL_TABLET | Freq: Every day | ORAL | Status: DC

## 2013-04-19 NOTE — Progress Notes (Signed)
CSW received notification from RN that pt requesting to complete Advanced Directives.  CSW met with pt and pt daughter at bedside.  CSW reviewed Advanced Directive packet with pt and confirmed with pt that completed Advanced Directive packet was correct.  Appreciate hospital [facility] assistance with notarizing documents.  Pt for discharge home with [facility] services today.  CSW signing off.  Jacklynn Lewis, MSW, LCSWA  Clinical Social Work [phone]

## 2013-04-19 NOTE — Progress Notes (Signed)
Occupational Therapy Treatment Patient Details Name: Danielle Bullock MRN: [id number] DOB: 09/29/1921 Today's Date: 04/19/2013 Time: 1017-1040 OT Time Calculation (min): 23 min  OT Assessment / Plan / Recommendation  History of present illness 79 yo female admitted with COPD exacerbation. Hx of breast cancer with mets to lung, liver.       Follow Up Recommendations  Supervision/Assistance - 24 hour    Barriers to Discharge       Equipment Recommendations  None recommended by OT    Recommendations for Other Services    Frequency Min 2X/week   Progress towards OT Goals Progress towards OT goals: Progressing toward goals  Plan Discharge plan remains appropriate           ADL  Grooming: Min guard;Denture care;Teeth care Where Assessed - Grooming: Unsupported standing Toilet Transfer: Min Pension scheme manager Method: Sit to Barista: Comfort height toilet Transfers/Ambulation Related to ADLs: Pt stood at sink for approx 10 minutes.  Pt does feel she is getting stronger         OT Goals(current goals can now be found in the care plan section)    Visit Information  Last OT Received On: 04/19/13 Assistance Needed: +1 History of Present Illness: 79 yo female admitted with COPD exacerbation. Hx of breast cancer with mets to lung, liver.           Cognition  Cognition Arousal/Alertness: Awake/alert Behavior During Therapy: WFL for tasks assessed/performed Overall Cognitive Status: Within Functional Limits for tasks assessed    Mobility  Bed Mobility Bed Mobility: Supine to Sit Supine to Sit: 5: Supervision;HOB elevated;With rails Transfers Sit to Stand: 5: Supervision;From bed;With upper extremity assist Stand to Sit: 5: Supervision;To bed          End of Session OT - End of Session Activity Tolerance: Patient tolerated treatment well Patient left: in bed;with call bell/phone within reach;with family/visitor present  GO     Leena Tiede,  [place] 04/19/2013, 10:52 AM

## 2013-04-19 NOTE — Discharge Summary (Signed)
Physician Discharge Summary  Danielle Bullock AVW:[id number] DOB: [birth year] DOA: 04/15/2013  PCP: No PCP Per Patient  Admit date: 04/15/2013 Discharge date: 04/19/2013  Recommendations for Outpatient Follow-up:  1. Referrals provided per our discussion for pulmonology, nephrology and ENT 2. You have completed course of antibiotics, 5 days of Levaquin so you do not need antibiotics at the time of discharge 3. Please follow up with nephrology in regards to your chronic kidney disease. Creatinine on discharge 1.29 which is much better since admission. We held your blood pressure medication Lotrel as it may have worsened your kidney function 4. please note that we have increased propranolol to 60 mg daily instead of 40 mg daily. 5. continue taking prednisone taper down by 5 mg a day. Start with 50 mg a day down to 0 mg and then stop. Referral provided for pulmonologist  Discharge Diagnoses:  Principal Problem:   COPD exacerbation Active Problems:   CKD (chronic kidney disease), stage III   HTN (hypertension)   CAD (coronary artery disease)    Discharge Condition: medically stable for discharge home today; HHRN and HHPT/OT order in place   Diet recommendation: As tolerated  History of present illness:  79 yo female with metastatic breast cancer with metastasis to lungs and liver who presents to [facility] ED with main concern of several days duration of progressively worsening shortness of breath associated with productive cough of yellow sputum, subjective fevers, chills, malaise and poor oral intake. In ED, patient was noted to have mild dyspnea and with oxygen saturations in high 80's to low 90's on RA. She was placed on oxygen and was started on nebulizer tx, responded well. Chest x-ray showed no active cardiopulmonary disease. Lung perfusion scan showed low probability for pulmonary embolism.   Assessment and Plan:   Principal Problem:  Acute hypoxic respiratory failure  - initial concern was  for pulmonary embolism. Lung perfusion scan done in the setting of renal failure, showed low probability for pulmonary embolism  - Patient is on nebulizer treatments for COPD exacerbation. Using in hospital albuterol and Atrovent every 4 hours scheduled and every 2 hours as needed. On discharge we prescribed the same nebulizers but every 6 hours PRN  - Discontinue Solu-Medrol and will prescribe prednisone taper on discharge as described above - Patient has completed course of Levaquin for 5 days. She does not need antibiotics at the time of discharge - Followup blood culture results - negative to date  - Influenza negative. Legionella and strep pneumonia results negative.  Active Problems:  Acute COPD exacerbation  - Management as above  - prednisone taper on discharge  CKD (chronic kidney disease), stage III  - creatine overall at baseline but may be increased due to lasix and benazepril pt is taking at home  - both meds held initially. We restarted Lasix but still held Lotrel. HTN (hypertension)  - Continue propranolol but increase to 60 mg daily because we are holding Lotrel  CAD (coronary artery disease)  - clinically stable  - Continue Aspirin and Plavix    Code Status: full cdde  Family Communication:  family at the bedside  Disposition Plan: home today  Manson Passey, MD  [facility]  Pager [phone]   Consultants:  None  Procedures:  NM perfusion scan 04/15/2013 - low probability for PE Antibiotics:  Levaquin 04/15/2013 --> 04/19/2013    Signed:  Manson Passey, MD  [facility] 04/19/2013, 11:34 AM  Pager #: [phone]   Discharge Exam: Ceasar Mons Vitals:  04/19/13 0638  BP: 145/65  Pulse: 79  Temp: 98.3 F (36.8 C)  Resp: 16   Filed Vitals:   04/18/13 1456 04/18/13 2010 04/19/13 0638 04/19/13 0732  BP:  137/62 145/65   Pulse:  74 79   Temp:  98.1 F (36.7 C) 98.3 F (36.8 C)   TempSrc:  Oral Oral   Resp:  16 16   Height:      Weight:       SpO2: 98% 99% 98% 95%    General: Pt is alert, follows commands appropriately, not in acute distress Cardiovascular: Regular rate and rhythm, S1/S2 +, no murmurs, no rubs, no gallops Respiratory: Mild wheezing and upper lung lobes, no crackles Abdominal: Soft, non tender, non distended, bowel sounds +, no guarding Extremities: no edema, no cyanosis, pulses palpable bilaterally DP and PT Neuro: Grossly nonfocal  Discharge Instructions  Discharge Orders   Future Appointments Provider Department Dept Phone   04/30/2013 11:00 AM [facility] Provider 2 [facility] [phone]   04/30/2013 2:15 PM [facility] [phone]   04/30/2013 2:45 PM Victorino December, MD [facility] [phone]   Future Orders Complete By Expires   Call MD for:  difficulty breathing, headache or visual disturbances  As directed    Call MD for:  persistant dizziness or light-headedness  As directed    Call MD for:  persistant nausea and vomiting  As directed    Call MD for:  severe uncontrolled pain  As directed    Diet - low sodium heart healthy  As directed    Discharge instructions  As directed    Comments:     1. Referrals provided per our discussion for pulmonology, nephrology and ENT 2. You have completed course of antibiotics, 5 days of Levaquin so you do not need antibiotics at the time of discharge 3. Please follow up with nephrology in regards to your chronic kidney disease. Creatinine on discharge 1.29 which is much better since admission. We held your blood pressure medication Lotrel as it may have worsened your kidney function 4. please note that we have increased propranolol to 60 mg daily instead of 40 mg daily. 5. continue taking prednisone taper down by 5 mg a day. Start with 50 mg a day down to 0 mg and then stop. Referral provided for pulmonologist   Increase activity slowly  As  directed        Medication List    STOP taking these medications       amLODipine-benazepril 10-20 MG per capsule  Commonly known as:  LOTREL      TAKE these medications       albuterol 108 (90 BASE) MCG/ACT inhaler  Commonly known as:  PROAIR HFA  Inhale 2 puffs into the lungs every 4 (four) hours as needed for wheezing or shortness of breath.     albuterol (5 MG/ML) 0.5% nebulizer solution  Commonly known as:  PROVENTIL  Take 0.5 mLs (2.5 mg total) by nebulization every 6 (six) hours as needed for wheezing or shortness of breath.     aspirin 325 MG tablet  Take 325 mg by mouth daily.     budesonide 0.5 MG/2ML nebulizer solution  Commonly known as:  PULMICORT  Take 2 mLs (0.5 mg total) by nebulization 2 (two) times daily.     CALCIUM 600 PO  Take 600 mg by mouth.  clopidogrel 75 MG tablet  Commonly known as:  PLAVIX  Take 1 tablet (75 mg total) by mouth daily.     ezetimibe 10 MG tablet  Commonly known as:  ZETIA  Take 1 tablet (10 mg total) by mouth daily.     furosemide 40 MG tablet  Commonly known as:  LASIX  Take 1 tablet (40 mg total) by mouth daily.     HYDROcodone-acetaminophen 5-325 MG per tablet  Commonly known as:  NORCO/VICODIN  Take 1-2 tablets by mouth every 4 (four) hours as needed.     ipratropium 0.02 % nebulizer solution  Commonly known as:  ATROVENT  Take 2.5 mLs (0.5 mg total) by nebulization every 6 (six) hours as needed.     letrozole 2.5 MG tablet  Commonly known as:  FEMARA  Take 1 tablet (2.5 mg total) by mouth daily.     lovastatin 40 MG tablet  Commonly known as:  MEVACOR  Take 80 mg by mouth at bedtime.     OVER THE COUNTER MEDICATION  Apply 1 application topically daily as needed (for pain).     potassium chloride SA 20 MEQ tablet  Commonly known as:  K-DUR,KLOR-CON  Take 20 mEq by mouth daily.     predniSONE 5 MG tablet  Commonly known as:  DELTASONE  Take 1 tablet (5 mg total) by mouth daily.     propranolol 60  MG tablet  Commonly known as:  INDERAL  Take 1 tablet (60 mg total) by mouth daily.     SENNA LAX PO  Take by mouth.     sodium bicarbonate 650 MG tablet  Take 650 mg by mouth 2 (two) times daily.     Vitamin D3 2000 UNITS Tabs  Take 2,000 Int'l Units by mouth daily.           Follow-up Information   Follow up with [facility]     On 04/30/2013. (11am with Dr. Jerral Ralph)    Contact information:   [address] [place] [address]        The results of significant diagnostics from this hospitalization (including imaging, microbiology, ancillary and laboratory) are listed below for reference.    Significant Diagnostic Studies: Dg Neck Soft Tissue  04/15/2013   CLINICAL DATA:  Short of breath, wheezing, hemoptysis to  EXAM: NECK SOFT TISSUES - 1+ VIEW  COMPARISON:  Concurrently obtained chest x-ray  FINDINGS: There is no evidence of retropharyngeal soft tissue swelling or epiglottic enlargement. The cervical airway is unremarkable and no radio-opaque foreign body identified. Electronic device is in the bilateral external auditory canals are consistent with a hearing aids. Atherosclerotic plaque is noted within the left common carotid artery. Mild multilevel cervical spondylosis without focality. No acute fracture identified. Incompletely imaged median sternotomy. Atherosclerotic calcifications are noted in the transverse aorta. The visualized lung apices appear clear.  IMPRESSION: No focal soft tissue abnormality detected by conventional radiography.  Atherosclerosis including left carotid artery and aortic arch disease.   Electronically Signed   By: Malachy Moan M.D.   On: 04/15/2013 13:31   Dg Chest 2 View  04/15/2013   CLINICAL DATA:  Short of breath, cough  EXAM: CHEST  2 VIEW  COMPARISON:  None.  FINDINGS: Sternotomy wires overlie normal cardiac silhouette. No effusion, infiltrate, or pneumothorax. Degenerative osteophytosis of the  thoracic spine. Left lymphadenectomy clips noted.  IMPRESSION: No active cardiopulmonary disease.   Electronically Signed   By: Roseanne Reno  Amil Amen M.D.   On: 04/15/2013 13:06   Nm Pulmonary Perf And Vent  04/15/2013   CLINICAL DATA:  Shortness of Breath  EXAM: NUCLEAR MEDICINE VENTILATION - PERFUSION LUNG SCAN  Views: Anterior, posterior, left lateral, right lateral, RPO, LPO, RAO, LAO -ventilation and perfusion  Radionuclide: Technetium 41m DTPA -ventilation; Technetium 60m macroaggregated albumin-perfusion  Dose: 43.9 mCi-ventilation; 5.7 mCi-perfusion  Route of administration: Inhalation -ventilation; intravenous -perfusion  COMPARISON:  Chest radiograph April 15, 2013  FINDINGS: The ventilation study shows homogeneous and symmetric uptake of radiotracer bilaterally.  The perfusion study shows homogeneous and symmetric radiotracer uptake bilaterally.  There is no appreciable ventilation/ perfusion mismatch.  IMPRESSION: No ventilation or perfusion defects. Very low probability of pulmonary embolus.   Electronically Signed   By: Bretta Bang M.D.   On: 04/15/2013 15:14    Microbiology: Recent Results (from the past 240 hour(s))  CULTURE, BLOOD (ROUTINE X 2)     Status: None   Collection Time    04/15/13  7:00 PM      Result Value Range Status   Specimen Description BLOOD RIGHT HAND   Final   Special Requests BOTTLES DRAWN AEROBIC AND ANAEROBIC 10CC   Final   Culture  Setup Time     Final   Value: 04/15/2013 23:19     Performed at [facility]   Culture     Final   Value:        BLOOD CULTURE RECEIVED NO GROWTH TO DATE CULTURE WILL BE HELD FOR 5 DAYS BEFORE ISSUING A FINAL NEGATIVE REPORT     Performed at [facility]   Report Status PENDING   Incomplete  CULTURE, BLOOD (ROUTINE X 2)     Status: None   Collection Time    04/15/13  7:00 PM      Result Value Range Status   Specimen Description BLOOD RIGHT HAND   Final   Special Requests BOTTLES DRAWN AEROBIC AND  ANAEROBIC 10CC   Final   Culture  Setup Time     Final   Value: 04/15/2013 23:20     Performed at [facility]   Culture     Final   Value:        BLOOD CULTURE RECEIVED NO GROWTH TO DATE CULTURE WILL BE HELD FOR 5 DAYS BEFORE ISSUING A FINAL NEGATIVE REPORT     Performed at [facility]   Report Status PENDING   Incomplete     Labs: Basic Metabolic Panel:  Recent Labs Lab 04/15/13 1226 04/15/13 2024 04/16/13 0347 04/19/13 0345  NA 134* 136 135 139  K 3.7 3.7 4.2 3.4*  CL 98 101 103 104  CO2 24 20 22 23   GLUCOSE 97 282* 144* 120*  BUN 21 20 18  24*  CREATININE 1.52* 1.44* 1.45* 1.29*  CALCIUM 10.0 9.8 9.2 8.7  MG  --  2.0  --   --   PHOS  --  2.7  --   --    Liver Function Tests:  Recent Labs Lab 04/15/13 1226 04/15/13 2024 04/16/13 0347  AST 27 28 26   ALT 20 21 20   ALKPHOS 82 86 85  BILITOT 0.6 0.5 0.4  PROT 7.8 8.0 7.5  ALBUMIN 4.2 3.9 3.7   No results found for this basename: LIPASE, AMYLASE,  in the last 168 hours No results found for this basename: AMMONIA,  in the last 168 hours CBC:  Recent Labs Lab 04/15/13 1226 04/15/13 2024 04/16/13 0347  04/19/13 0345  WBC 7.9 6.3 8.7 14.9*  NEUTROABS 4.5 4.8  --   --   HGB 14.8 15.0 14.2 14.9  HCT 42.9 44.3 41.9 44.8  MCV 89.7 89.7 90.7 90.1  PLT 230 244 233 249   Cardiac Enzymes:  Recent Labs Lab 04/15/13 1226  TROPONINI <0.30   BNP: BNP (last 3 results)  Recent Labs  04/15/13 1226  PROBNP 78.1   CBG:  Recent Labs Lab 04/16/13 0744 04/17/13 0740 04/18/13 0729  GLUCAP 118* 109* 119*    Time coordinating discharge: Over 30 minutes

## 2013-04-19 NOTE — Care Management Note (Signed)
Per pt [facility] to provide [facility] services upon discharge. [facility] rep [employer] notified.    Roxy Manns Kirtis Challis,RN,MSN [phone]

## 2013-04-19 NOTE — Progress Notes (Signed)
Patient was stable at time of discharge. IV removed. I reviewed discharge packet with patient and family. They verbalized understanding and did not have further questions. Patient left with belongings and prescriptions.

## 2013-04-21 LAB — CULTURE, BLOOD (ROUTINE X 2)
Culture: NO GROWTH
Culture: NO GROWTH

## 2013-04-30 ENCOUNTER — Ambulatory Visit (HOSPITAL_BASED_OUTPATIENT_CLINIC_OR_DEPARTMENT_OTHER): Payer: Medicare Other | Admitting: Oncology

## 2013-04-30 ENCOUNTER — Encounter: Payer: Self-pay | Admitting: Oncology

## 2013-04-30 ENCOUNTER — Ambulatory Visit: Payer: Medicare Other | Attending: Internal Medicine | Admitting: Internal Medicine

## 2013-04-30 ENCOUNTER — Other Ambulatory Visit (HOSPITAL_BASED_OUTPATIENT_CLINIC_OR_DEPARTMENT_OTHER): Payer: Medicare Other

## 2013-04-30 ENCOUNTER — Encounter: Payer: Self-pay | Admitting: Internal Medicine

## 2013-04-30 VITALS — BP 148/84 | HR 58 | Temp 97.5°F | Resp 14 | Ht 63.0 in | Wt 146.0 lb

## 2013-04-30 VITALS — BP 144/78 | HR 85 | Temp 97.5°F | Resp 18 | Ht 63.0 in | Wt 140.7 lb

## 2013-04-30 DIAGNOSIS — C50912 Malignant neoplasm of unspecified site of left female breast: Secondary | ICD-10-CM

## 2013-04-30 DIAGNOSIS — C787 Secondary malignant neoplasm of liver and intrahepatic bile duct: Secondary | ICD-10-CM

## 2013-04-30 DIAGNOSIS — C801 Malignant (primary) neoplasm, unspecified: Secondary | ICD-10-CM

## 2013-04-30 DIAGNOSIS — R490 Dysphonia: Secondary | ICD-10-CM

## 2013-04-30 DIAGNOSIS — G4700 Insomnia, unspecified: Secondary | ICD-10-CM

## 2013-04-30 DIAGNOSIS — I129 Hypertensive chronic kidney disease with stage 1 through stage 4 chronic kidney disease, or unspecified chronic kidney disease: Secondary | ICD-10-CM | POA: Insufficient documentation

## 2013-04-30 DIAGNOSIS — Z853 Personal history of malignant neoplasm of breast: Secondary | ICD-10-CM | POA: Insufficient documentation

## 2013-04-30 DIAGNOSIS — Z23 Encounter for immunization: Secondary | ICD-10-CM

## 2013-04-30 DIAGNOSIS — C7A00 Malignant carcinoid tumor of unspecified site: Secondary | ICD-10-CM

## 2013-04-30 DIAGNOSIS — C799 Secondary malignant neoplasm of unspecified site: Secondary | ICD-10-CM

## 2013-04-30 DIAGNOSIS — J449 Chronic obstructive pulmonary disease, unspecified: Secondary | ICD-10-CM

## 2013-04-30 DIAGNOSIS — N184 Chronic kidney disease, stage 4 (severe): Secondary | ICD-10-CM | POA: Insufficient documentation

## 2013-04-30 DIAGNOSIS — J4489 Other specified chronic obstructive pulmonary disease: Secondary | ICD-10-CM | POA: Insufficient documentation

## 2013-04-30 DIAGNOSIS — I10 Essential (primary) hypertension: Secondary | ICD-10-CM

## 2013-04-30 DIAGNOSIS — J3800 Paralysis of vocal cords and larynx, unspecified: Secondary | ICD-10-CM

## 2013-04-30 DIAGNOSIS — G4733 Obstructive sleep apnea (adult) (pediatric): Secondary | ICD-10-CM

## 2013-04-30 LAB — CBC WITH DIFFERENTIAL/PLATELET
EOS%: 1 % (ref 0.0–7.0)
Eosinophils Absolute: 0.1 10*3/uL (ref 0.0–0.5)
HCT: 44.4 % (ref 34.8–46.6)
LYMPH%: 25.2 % (ref 14.0–49.7)
MCH: 30 pg (ref 25.1–34.0)
MCHC: 32.3 g/dL (ref 31.5–36.0)
MCV: 92.7 fL (ref 79.5–101.0)
MONO#: 0.9 10*3/uL (ref 0.1–0.9)
MONO%: 7.4 % (ref 0.0–14.0)
NEUT#: 7.7 10*3/uL — ABNORMAL HIGH (ref 1.5–6.5)
NEUT%: 65.9 % (ref 38.4–76.8)
Platelets: 207 10*3/uL (ref 145–400)
RBC: 4.79 10*6/uL (ref 3.70–5.45)
WBC: 11.7 10*3/uL — ABNORMAL HIGH (ref 3.9–10.3)

## 2013-04-30 LAB — COMPREHENSIVE METABOLIC PANEL (CC13)
ALT: 42 U/L (ref 0–55)
Alkaline Phosphatase: 92 U/L (ref 40–150)
Anion Gap: 9 mEq/L (ref 3–11)
CO2: 23 mEq/L (ref 22–29)
Creatinine: 1.3 mg/dL — ABNORMAL HIGH (ref 0.6–1.1)
Glucose: 74 mg/dl (ref 70–140)
Potassium: 3.5 mEq/L (ref 3.5–5.1)
Sodium: 140 mEq/L (ref 136–145)
Total Bilirubin: 0.77 mg/dL (ref 0.20–1.20)

## 2013-04-30 MED ORDER — ESZOPICLONE 3 MG PO TABS: 3.0000 mg | ORAL_TABLET | Freq: Every day | ORAL | Status: DC

## 2013-04-30 NOTE — Patient Instructions (Signed)
Vocal Cord Paralysis One-sided vocal fold paralysis (UVFP) is caused by injury to the nerve of the affectedvocal cord (vocal fold). The vocal cords are located in the voice box (larynx). The vocal cords are needed to produce your voice and also close the voice box during swallowing to prevent aspiration. Aspiration means the food would go down into the lungs. People with one paralyzed vocal cord often complain of choking when drinking liquids. They rarely have difficulty swallowing solid foods. The problem with swallowing liquids usually gets better over time and needs no treatment. Any adult patient with hoarseness and a history of tobacco use may have a cancer of the voice box. Alcohol use increases the risk of head and neck cancer in smokers. Once the cause of the problem is known, treatment may begin. TREATMENT   A cord injury can make the voice sound "breathy" or hoarse. Voice therapy is usually tried first. If it is unsuccessful, surgical treatment is considered.  If swallowing problems persist for months, a vocal cord medialization procedure may be helpful. This procedure simply pushes the paralyzed vocal cord to the middle so that the working, moving vocal cord can close off the larynx during swallowing. This procedure improves both voice quality and swallowing. It is usually done by a physician in the Department of Otorhinolaryngology. The standard surgical treatment is called vocal fold medialization. This surgery tries to bring the injured cord to the midline. An alternative surgical treatment is vocal fold reinnervation. This surgery is an attempt to bring a new nerve supply to the injured cord. The reinnervation operation has some advantages over the medialization operation. It also requires several months to see the end result.  Before having this type of procedure, your caregiver will give you several tests. This is done before surgery, and at 6 and 12 months after surgery. The testing  includes:  Voice recordings.  Movies made of vocal cord function.  Airflow and pressure measurements of the voice box.  An outcomes questionnaire. Vocal cord medialization procedures help with voice quality and swallowing liquids. Swallowing solid foods is generally not a problem. There are two kinds of vocal cord medialization procedures:  Injection  The surgeon injects a material into the paralyzed vocal cord, either through the mouth or through the neck skin. The material fills the vocal cord and pushes it to the midline. This can be done as an inpatient or outpatient procedure.  Surgery (Thyroplasty)  Treatment of problem-causing vocal cord paralysis of one cord is often surgical. Medialization of the vocal cord using Teflon injection works. This technique may produce stiffness of the vocal fold. This may cause poor voice quality. Over-correction may also occur. Over-correction is not reversible.  In another procedure, a window of cartilage is removed from the outside of the voice box. Then, a precisely measured piece of solid silicone is inserted through the window and pushed against the paralyzed vocal cord, moving it to the midline. Thyroplasty is almost always done in the hospital under local anesthesia. That is medicine that makes you numb or puts you to sleep. There are many ways to approach this problem. Your caregiver will suggest what may work best for you.  Document Released: 04/25/2004 Document Revised: 09/09/2011 Document Reviewed: 06/17/2005 [facility] Patient Information 2014 [place], [place]. Chronic Obstructive Pulmonary Disease Chronic obstructive pulmonary disease (COPD) is a condition in which airflow from the lungs is restricted. The lungs can never return to normal, but there are measures you can take which will improve them and  make you feel better. CAUSES   Smoking.  Exposure to secondhand smoke.  Breathing in irritants such as air pollution, dust, cigarette  smoke, strong odors, aerosol sprays, or paint fumes.  History of lung infections. SYMPTOMS   Deep, persistent (chronic) cough with a large amount of thick mucus.  Wheezing.  Shortness of breath, especially with physical activity.  Feeling like you cannot get enough air.  Difficulty breathing.  Rapid breaths (tachypnea).  Gray or bluish discoloration (cyanosis) of the skin, especially in fingers, toes, or lips.  Fatigue.  Weight loss.  Swelling in legs, ankles, or feet.  Fast heartbeat (tachycardia).  Frequent lung infections.   Chest tightness. DIAGNOSIS  Initial diagnosis may be based on your history, symptoms, and physical examination. Additional tests for COPD may include:  Chest X-ray.  Computed tomography (CT) scan.  Lung (pulmonary) function tests.  Blood tests. TREATMENT  Treatment focuses on making you comfortable (supportive care). Your caregiver may prescribe medicines (inhaled or pills) to help improve your breathing. Additional treatment options may include oxygen therapy and pulmonary rehabilitation. Treatment should also include reducing your exposure to known irritants and following a plan to stop smoking. HOME CARE INSTRUCTIONS   Take all medicines, including antibiotic medicines, as directed by your caregiver.  Use inhaled medicines as directed by your caregiver.  Avoid medicines or cough syrups that dry up your airway (antihistamines) and slow down the elimination of secretions. This decreases respiratory capacity and may lead to infections.  If you smoke, stop smoking.  Avoid exposure to smoke, chemicals, and fumes that aggravate your breathing.  Avoid contact with individuals that have a contagious illness.  Avoid extreme temperature and humidity changes.  Use humidifiers at home and at your bedside if they do not make breathing difficult.  Drink enough water and fluids to keep your urine clear or pale yellow. This loosens  secretions.  Eat healthy foods. Eating smaller, more frequent meals and resting before meals may help you maintain your strength.  Ask your caregiver about the use of vitamins and mineral supplements.  Stay active. Exercise and physical activity will help maintain your ability to do things you want to do.  Balance activity with periods of rest.  Assume a position of comfort if you become short of breath.  Learn and use relaxation techniques.  Learn and use controlled breathing techniques as directed by your caregiver. Controlled breathing techniques include:  Pursed lip breathing. This breathing technique starts with breathing in (inhaling) through your nose for 1 second. Next, purse your lips as if you were going to whistle. Then breathe out (exhale) through the pursed lips for 2 seconds.  Diaphragmatic breathing. Start by putting one hand on your abdomen just above your waist. Inhale slowly through your nose. The hand on your abdomen should move out. Then exhale slowly through pursed lips. You should be able to feel the hand on your abdomen moving in as you exhale.  Learn and use controlled coughing to clear mucus from your lungs. Controlled coughing is a series of short, progressive coughs. The steps of controlled coughing are: 1. Lean your head slightly forward. 2. Breathe in deeply using diaphragmatic breathing. 3. Try to hold your breath for 3 seconds. 4. Keep your mouth slightly open while coughing twice. 5. Spit any mucus out into a tissue. 6. Rest and repeat the steps once or twice as needed.  Receive all protective vaccines your caregiver suggests, especially pneumococcal and influenza vaccines.  Learn to manage stress.  Schedule and attend all follow-up appointments as directed by your caregiver. It is important to keep all your appointments.  Participate in pulmonary rehabilitation as directed by your caregiver.  Use home oxygen as suggested. SEEK MEDICAL CARE IF:    You are coughing up more mucus than usual.  There is a change in the color or thickness of the mucus.  Breathing is more labored than usual.  Your breathing is faster than usual.  Your skin color is more cyanotic than usual.  You are running out of the medicine you take for your breathing.  You are anxious, apprehensive, or restless.  You have a fever. SEEK IMMEDIATE MEDICAL CARE IF:   You have a rapid heart rate.  You have shortness of breath while you are resting.  You have shortness of breath that prevents you from being able to talk.  You have shortness of breath that prevents you from performing your usual physical activities.  You have chest pain lasting longer than 5 minutes.  You have a seizure.  Your family or friends notice that you are agitated or confused. MAKE SURE YOU:   Understand these instructions.  Will watch your condition.  Will get help right away if you are not doing well or get worse. Document Released: 03/27/2005 Document Revised: 03/11/2012 Document Reviewed: 08/17/2010 [facility] Patient Information 2014 [place], [place]. Insomnia Insomnia is frequent trouble falling and/or staying asleep. Insomnia can be a long term problem or a short term problem. Both are common. Insomnia can be a short term problem when the wakefulness is related to a certain stress or worry. Long term insomnia is often related to ongoing stress during waking hours and/or poor sleeping habits. Overtime, sleep deprivation itself can make the problem worse. Every little thing feels more severe because you are overtired and your ability to cope is decreased. CAUSES   Stress, anxiety, and depression.  Poor sleeping habits.  Distractions such as TV in the bedroom.  Naps close to bedtime.  Engaging in emotionally charged conversations before bed.  Technical reading before sleep.  Alcohol and other sedatives. They may make the problem worse. They can hurt normal sleep  patterns and normal dream activity.  Stimulants such as caffeine for several hours prior to bedtime.  Pain syndromes and shortness of breath can cause insomnia.  Exercise late at night.  Changing time zones may cause sleeping problems (jet lag). It is sometimes helpful to have someone observe your sleeping patterns. They should look for periods of not breathing during the night (sleep apnea). They should also look to see how long those periods last. If you live alone or observers are uncertain, you can also be observed at a sleep clinic where your sleep patterns will be professionally monitored. Sleep apnea requires a checkup and treatment. Give your caregivers your medical history. Give your caregivers observations your family has made about your sleep.  SYMPTOMS   Not feeling rested in the morning.  Anxiety and restlessness at bedtime.  Difficulty falling and staying asleep. TREATMENT   Your caregiver may prescribe treatment for an underlying medical disorders. Your caregiver can give advice or help if you are using alcohol or other drugs for self-medication. Treatment of underlying problems will usually eliminate insomnia problems.  Medications can be prescribed for short time use. They are generally not recommended for lengthy use.  Over-the-counter sleep medicines are not recommended for lengthy use. They can be habit forming.  You can promote easier sleeping by making lifestyle changes such  as:  Using relaxation techniques that help with breathing and reduce muscle tension.  Exercising earlier in the day.  Changing your diet and the time of your last meal. No night time snacks.  Establish a regular time to go to bed.  Counseling can help with stressful problems and worry.  Soothing music and white noise may be helpful if there are background noises you cannot remove.  Stop tedious detailed work at least one hour before bedtime. HOME CARE INSTRUCTIONS   Keep a diary.  Inform your caregiver about your progress. This includes any medication side effects. See your caregiver regularly. Take note of:  Times when you are asleep.  Times when you are awake during the night.  The quality of your sleep.  How you feel the next day. This information will help your caregiver care for you.  Get out of bed if you are still awake after 15 minutes. Read or do some quiet activity. Keep the lights down. Wait until you feel sleepy and go back to bed.  Keep regular sleeping and waking hours. Avoid naps.  Exercise regularly.  Avoid distractions at bedtime. Distractions include watching television or engaging in any intense or detailed activity like attempting to balance the household checkbook.  Develop a bedtime ritual. Keep a familiar routine of bathing, brushing your teeth, climbing into bed at the same time each night, listening to soothing music. Routines increase the success of falling to sleep faster.  Use relaxation techniques. This can be using breathing and muscle tension release routines. It can also include visualizing peaceful scenes. You can also help control troubling or intruding thoughts by keeping your mind occupied with boring or repetitive thoughts like the old concept of counting sheep. You can make it more creative like imagining planting one beautiful flower after another in your backyard garden.  During your day, work to eliminate stress. When this is not possible use some of the previous suggestions to help reduce the anxiety that accompanies stressful situations. MAKE SURE YOU:   Understand these instructions.  Will watch your condition.  Will get help right away if you are not doing well or get worse. Document Released: 06/14/2000 Document Revised: 09/09/2011 Document Reviewed: 07/15/2007 [facility] Patient Information 2014 [place], [place].

## 2013-04-30 NOTE — Progress Notes (Signed)
Danielle Bullock [id number] 04-09-1922 79 y.o. 04/30/2013 3:00 PM  CC Dr. Ovidio Kin Dr. Dorothyann Peng  Diagnosis 79 year old female with metastatic carcinoma unknown primary. patient is relocating to [facility] to be closer to her family.  STAGE:  Stage IV with liver metastasis.   REFERRING PHYSICIAN: Dr. Spero Curb  Prior therapy:  Danielle Bullock is a 79 y.o. female.   #1 With multiple medical problems including COPD congestive heart failure coronary artery disease. Patient developed hoarseness for about a 3 month period. She went to be in 2 for an evaluation and was found to have one of local cord paralysis. She subsequently had CT scans performed of the chest. These scans failed to show any definite mediastinal adenopathy or other primary pathology but it did show multifocal bilateral nodules in both lungs. In the liver she was found to have a 2.5 x 3 cm lesion suspicious for metastatic disease.   #2 Patient apparently has a history of having had breast cancer about 10 years ago which was treated with a left mastectomy and 5 years of tamoxifen. Because of the liver lesion patient had a biopsy performed that showed carcinoma with some neuroendocrine features. Unknown primary. Patient had CA 27-29 marker that was moderately elevated 74.3 CEA 10.3 and  Chromogranin A. Was borderline elevated at 7 an alpha-fetoprotein was negative.special stains were performed on the biopsy including EGF mutation which was negative therefore making it difficult to treat with Tarceva or a thick neck. She was also HER-2/neu negative. Past ID gene RA was sent but there was no time to be completed. Patient has relocated to [facility].  Patient's original oncologist recommended that he patient to put on aromatase inhibitor I letrozole 2.5 mg daily. Other 8 discussion was chemotherapy for lung cancer with pemetrexed and carboplatinum or pemetrexed alone. Patient did not want to have  chemotherapy.   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  #3 vocal cord paresis  #5 hyperlipidemia, hypertension, ASHD, COPD  Past Surgical History: Patient is status post mastectomy  Family History: Noncontributory  Interval history: Patient is seen in followup today. This is her 3 month followup after starting the Arimidex. She tolerated it well so far. She however apparently did develop some bronchitis and had hospitalization. She is recovered from the bronchitis and is doing well. She denies any headaches double vision blurring of vision. She occasionally does develop chest pains. She has no back pain no peripheral paresthesias no abdominal pain she has history of chronic constipation she does take a bowel regimen to have a bowel movement every day. She has no bleeding problems. Remainder of the 10 point review of systems is negative.  Current therapy: Letrozole 2.5 mg daily  Social History History  Substance Use Topics  . Smoking status: Former Smoker    Quit date: 04/15/1969  . Smokeless tobacco: Never Used  . Alcohol Use: No    Allergies: No Known Allergies  Current Medications: Current Outpatient Prescriptions  Medication Sig Dispense Refill  . albuterol (PROAIR HFA) 108 (90 BASE) MCG/ACT inhaler Inhale 2 puffs into the lungs every 4 (four) hours as needed for wheezing or shortness of breath.  1 Inhaler  3  . albuterol (PROVENTIL) (5 MG/ML) 0.5% nebulizer solution Take 0.5 mLs (2.5 mg total) by nebulization every 6 (six) hours as needed for wheezing or shortness of breath.  20 mL  3  . aspirin 325 MG tablet Take 325 mg by mouth daily.      . budesonide (PULMICORT) 0.5 MG/2ML nebulizer solution Take 2 mLs (0.5 mg total) by nebulization 2 (two) times daily.  2 mL  3  . Calcium Carbonate (CALCIUM 600 PO) Take 600 mg by mouth.       . Cholecalciferol (VITAMIN D3) 2000 UNITS TABS Take 2,000 Int'l Units by mouth daily.      . clopidogrel (PLAVIX) 75 MG tablet Take 1 tablet (75 mg total) by mouth daily.  30 tablet  3  . Eszopiclone (ESZOPICLONE) 3 MG TABS Take 1 tablet (3 mg total) by mouth at bedtime. Take immediately before bedtime when necessary for insomnia  90 tablet  3  . ezetimibe (ZETIA) 10 MG tablet Take 1 tablet (10 mg total) by mouth daily.  30 tablet  3  . furosemide (LASIX) 40 MG tablet Take 1 tablet (40 mg total) by mouth daily.  30 tablet  3  . HYDROcodone-acetaminophen (NORCO/VICODIN) 5-325 MG per tablet Take 1-2 tablets by mouth every 4 (four) hours as needed.  30 tablet  0  . ipratropium (ATROVENT) 0.02 % nebulizer solution Take 2.5 mLs (0.5 mg total) by nebulization every 6 (six) hours as needed.  75 mL  3  . letrozole (FEMARA) 2.5 MG tablet Take 1 tablet (2.5 mg total) by mouth daily.  90 tablet  6  . lovastatin (MEVACOR) 40 MG tablet Take 80 mg by mouth at bedtime.      Marland Kitchen OVER THE COUNTER MEDICATION Apply 1 application topically daily as needed (for pain).      . potassium chloride SA (K-DUR,KLOR-CON) 20 MEQ tablet Take 20 mEq by mouth daily.      . predniSONE (DELTASONE) 5 MG tablet Take 1 tablet (5 mg total) by mouth daily.  55 tablet  0  . propranolol (INDERAL) 60 MG tablet Take 1 tablet (60 mg total) by mouth daily.  30 tablet  3  . Sennosides (SENNA LAX PO) Take by mouth.      . sodium bicarbonate 650 MG tablet Take 650 mg by mouth 2 (two) times daily.       No current facility-administered medications for this visit.    OB/GYN History: patient had a hysterectomy in her 30s she had been on hormone rplacement therapy stopped about 10 years ago she is nulliparous  Fertility Discussion:n/a Prior History of Cancer: yes  Health Maintenance:  Colonoscopy yes Bone Density yes Last PAP smear unknown  ECOG PERFORMANCE STATUS: 1 - Symptomatic but completely ambulatory  Genetic Counseling/testing:  no  REVIEW OF SYSTEMS:  Patient does complain of irregular heartbeat she does have hearing loss and wears glasses she has shortness of breath on exertion she does complain of easy bruising arthritis and joint pains and numbness in the hands. Remainder of the 14 point review of systems is negative.  PHYSICAL EXAMINATION: Blood pressure 144/78, pulse 85, temperature 97.5 F (36.4 C), temperature source Oral, resp. rate 18, height 5\' 3"  (1.6 m), weight 140 lb 11.2 oz (63.821 kg).  WUJ:WJXBJ, healthy, no distress, well nourished and well developed SKIN: skin color, texture, turgor are normal HEAD: Normocephalic EYES: PERRLA, EOMI, Conjunctiva are pink and non-injected EARS: External ears normal OROPHARYNX:no exudate, no erythema and lips, buccal mucosa, and tongue normal  NECK: supple, no adenopathy LYMPH:  no palpable lymphadenopathy, no hepatosplenomegaly BREAST:left breast normal without mass, skin or nipple changes or axillary nodes, surgical scars noted  without any evidence of local recurrence LUNGS: clear to auscultation  HEART: regular rate & rhythm, no murmurs and no gallops ABDOMEN:abdomen soft, non-tender, normal bowel sounds and no masses or organomegaly BACK: Back symmetric, no curvature., No CVA tenderness EXTREMITIES:no edema, no clubbing, no cyanosis  NEURO: alert & oriented x 3 with fluent speech, no focal motor/sensory deficits, gait normal     STUDIES/RESULTS: No results found.   LABS:    Chemistry      Component Value Date/Time   NA 139 04/19/2013 0345   NA 139 03/10/2013 1101   K 3.4* 04/19/2013 0345   K 4.0 03/10/2013 1101   CL 104 04/19/2013 0345   CO2 23 04/19/2013 0345   CO2 23 03/10/2013 1101   BUN 24* 04/19/2013 0345   BUN 16.5 03/10/2013 1101   CREATININE 1.29* 04/19/2013 0345   CREATININE 1.5* 03/10/2013 1101      Component Value Date/Time   CALCIUM 8.7 04/19/2013 0345   CALCIUM 9.1 03/10/2013 1101   ALKPHOS 85 04/16/2013 0347   ALKPHOS 70  03/10/2013 1101   AST 26 04/16/2013 0347   AST 27 03/10/2013 1101   ALT 20 04/16/2013 0347   ALT 22 03/10/2013 1101   BILITOT 0.4 04/16/2013 0347   BILITOT 0.42 03/10/2013 1101      Lab Results  Component Value Date   WBC 11.7* 04/30/2013   HGB 14.4 04/30/2013   HCT 44.4 04/30/2013   MCV 92.7 04/30/2013   PLT 207 04/30/2013       PATHOLOGY: outdide pathology reviewed  ASSESSMENT    79 year old female with widely metastatic adenocarcinoma with neuroendocrine features that was ER positive EGFR negative and HER-2/neu negative.primary is most likely lung or a breast cancer. But since the tumor is ER positive and the patient wants to avoid any kind of chemotherapy she would be a good candidate for antiestrogen therapy with an aromatase inhibitor such as letrozole 2.5 mg daily. We discussed risks benefits rationale and side effects.  #2 patient was started on letrozole 2.5 mg daily. So far she is tolerating it well.  #3 patient was recently hospitalized for bronchitis.    PLAN: #1 continue letrozole 2.5 mg daily.  #2 patient will be seen back in 3 months time for followup and at that time we will order some restaging scans.     Thank you so much for allowing me to participate in the care of Danielle Bullock. I will continue to follow up the patient with you and assist in her care.  All questions were answered. The patient knows to call the clinic with any problems, questions or concerns. We can certainly see the patient much sooner if necessary.  I spent 20 minutes counseling the patient face to face. The total time spent in the appointment was 2 is great thank you very much 5 minutes.  Drue Second, MD Medical/Oncology [facility] [phone] (beeper) [phone] (Office)

## 2013-04-30 NOTE — Progress Notes (Signed)
Pt is here to establish care. Pt has stage 4 cancer in her breast and its metastasized to her lungs and liver. Pt is requesting a flu shot and a sleep apnea test.

## 2013-04-30 NOTE — Progress Notes (Signed)
Patient ID: Danielle Bullock, female   DOB: 13-Oct-1921, 79 y.o.   MRN: [id number] Patient Demographics  Danielle Bullock, is a 79 y.o. female  WUJ:[id number]  NFA:[id number]  DOB - 09/01/1921  CC:  Chief Complaint  Patient presents with  . Establish Care       HPI: Danielle Bullock is a 79 y.o. female here today to establish medical care. She has multiple medical problems including COPD, congestive heart failure, coronary artery disease. Patient developed hoarseness for about a 3 month period. She went for an evaluation and was found to have one of her vocal cord paralysis. She subsequently had CT scans performed of the chest. These scans failed to show any definite mediastinal adenopathy or other primary pathology but it did show multifocal bilateral nodules in both lungs. In the liver she was found to have a 2.5 x 3 cm lesion suspicious for metastatic disease. Patient apparently has a history of breast cancer about 10 years ago which was treated with a left mastectomy and 5 years of tamoxifen. Because of the liver lesion patient had a biopsy performed that showed carcinoma with some neuroendocrine features. Unknown primary. Patient had CA 27-29 marker that was moderately elevated 74.3 CEA 10.3 and Chromogranin A. Was borderline elevated at 7, and alpha-fetoprotein was negative, special stains were performed on the biopsy including EGF mutation which was negative. She was also HER-2/neu negative. Patient has relocated to [place], [place] recently to live with her daughter. Patient came to clinic today for a referral to specialists as recommended by the oncologist including ENT for hoarse voice and vocal cord paralysis, pulmonologist for multiple lung nodules and nephrologist for chronic kidney disease. She has no new complaints. Her voice is constantly hoarse, she breathes loudly. She has not had a sleep study. She quit smoking about 30 years ago, she does not drink alcohol. Patient has No headache, No  chest pain, No abdominal pain - No Nausea, No new weakness tingling or numbness, No Cough - SOB.  No Known Allergies Past Medical History  Diagnosis Date  . Cancer   . Cancer, metastatic   . COPD (chronic obstructive pulmonary disease)   . CHF (congestive heart failure)   . Complication of anesthesia     hard to wake up   Current Outpatient Prescriptions on File Prior to Visit  Medication Sig Dispense Refill  . albuterol (PROAIR HFA) 108 (90 BASE) MCG/ACT inhaler Inhale 2 puffs into the lungs every 4 (four) hours as needed for wheezing or shortness of breath.  1 Inhaler  3  . albuterol (PROVENTIL) (5 MG/ML) 0.5% nebulizer solution Take 0.5 mLs (2.5 mg total) by nebulization every 6 (six) hours as needed for wheezing or shortness of breath.  20 mL  3  . aspirin 325 MG tablet Take 325 mg by mouth daily.      . budesonide (PULMICORT) 0.5 MG/2ML nebulizer solution Take 2 mLs (0.5 mg total) by nebulization 2 (two) times daily.  2 mL  3  . Calcium Carbonate (CALCIUM 600 PO) Take 600 mg by mouth.      . Cholecalciferol (VITAMIN D3) 2000 UNITS TABS Take 2,000 Int'l Units by mouth daily.      . clopidogrel (PLAVIX) 75 MG tablet Take 1 tablet (75 mg total) by mouth daily.  30 tablet  3  . ezetimibe (ZETIA) 10 MG tablet Take 1 tablet (10 mg total) by mouth daily.  30 tablet  3  . furosemide (LASIX) 40 MG tablet Take 1 tablet (  40 mg total) by mouth daily.  30 tablet  3  . HYDROcodone-acetaminophen (NORCO/VICODIN) 5-325 MG per tablet Take 1-2 tablets by mouth every 4 (four) hours as needed.  30 tablet  0  . ipratropium (ATROVENT) 0.02 % nebulizer solution Take 2.5 mLs (0.5 mg total) by nebulization every 6 (six) hours as needed.  75 mL  3  . letrozole (FEMARA) 2.5 MG tablet Take 1 tablet (2.5 mg total) by mouth daily.  90 tablet  6  . lovastatin (MEVACOR) 40 MG tablet Take 80 mg by mouth at bedtime.      Marland Kitchen OVER THE COUNTER MEDICATION Apply 1 application topically daily as needed (for pain).      .  potassium chloride SA (K-DUR,KLOR-CON) 20 MEQ tablet Take 20 mEq by mouth daily.      . predniSONE (DELTASONE) 5 MG tablet Take 1 tablet (5 mg total) by mouth daily.  55 tablet  0  . propranolol (INDERAL) 60 MG tablet Take 1 tablet (60 mg total) by mouth daily.  30 tablet  3  . Sennosides (SENNA LAX PO) Take by mouth.      . sodium bicarbonate 650 MG tablet Take 650 mg by mouth 2 (two) times daily.       No current facility-administered medications on file prior to visit.   Family History  Problem Relation Age of Onset  . Hypertension Father   . Heart disease Father   . Hypertension Sister   . Hypertension Brother    History   Social History  . Marital Status: Unknown    Spouse Name: N/A    Number of Children: N/A  . Years of Education: N/A   Occupational History  . Not on file.   Social History Main Topics  . Smoking status: Former Smoker    Quit date: 04/15/1969  . Smokeless tobacco: Never Used  . Alcohol Use: No  . Drug Use: No  . Sexual Activity: Not on file   Other Topics Concern  . Not on file   Social History Narrative  . No narrative on file    Review of Systems: Constitutional: Negative for fever, chills, diaphoresis, activity change, appetite change and fatigue. HENT: Negative for ear pain, nosebleeds, congestion, facial swelling, rhinorrhea, neck pain, neck stiffness and ear discharge.  Eyes: Negative for pain, discharge, redness, itching and visual disturbance. Respiratory: Negative for choking, chest tightness, shortness of breath, wheezing and stridor.  Cardiovascular: Negative for chest pain, palpitations and leg swelling. Gastrointestinal: Negative for abdominal distention. Genitourinary: Negative for dysuria, urgency, frequency, hematuria, flank pain, decreased urine volume, difficulty urinating and dyspareunia.  Musculoskeletal: Negative for back pain, joint swelling, arthralgia and gait problem. Neurological: Negative for dizziness, tremors,  seizures, syncope, facial asymmetry, speech difficulty, weakness, light-headedness, numbness and headaches.  Hematological: Negative for adenopathy. Does not bruise/bleed easily. Psychiatric/Behavioral: Negative for hallucinations, behavioral problems, confusion, dysphoric mood, decreased concentration and agitation.    Objective:   Filed Vitals:   04/30/13 1109  BP: 148/84  Pulse: 58  Temp: 97.5 F (36.4 C)  Resp: 14    Physical Exam: Constitutional: Elderly woman, hard of hearing, not in obvious distress but audible breathing with coarse voice. HENT: Normocephalic, atraumatic, External right and left ear normal. Oropharynx is clear and moist.  Eyes: Conjunctivae and EOM are normal. PERRLA, no scleral icterus. Neck: Normal ROM. Neck supple. No JVD. No tracheal deviation. No thyromegaly. CVS: RRR, S1/S2 +, no murmurs, no gallops, no carotid bruit.  Pulmonary: Effort and breath  sounds normal, no stridor, rhonchi, wheezes, rales.  Abdominal: Soft. BS +, no distension, tenderness, rebound or guarding.  Musculoskeletal: Normal range of motion. No edema and no tenderness.  Lymphadenopathy: No lymphadenopathy noted, cervical, inguinal or axillary Neuro: Alert. Normal reflexes, muscle tone coordination. No cranial nerve deficit. Skin: Skin is warm and dry. No rash noted. Not diaphoretic. No erythema. No pallor. Psychiatric: Normal mood and affect. Behavior, judgment, thought content normal.  Lab Results  Component Value Date   WBC 14.9* 04/19/2013   HGB 14.9 04/19/2013   HCT 44.8 04/19/2013   MCV 90.1 04/19/2013   PLT 249 04/19/2013   Lab Results  Component Value Date   CREATININE 1.29* 04/19/2013   BUN 24* 04/19/2013   NA 139 04/19/2013   K 3.4* 04/19/2013   CL 104 04/19/2013   CO2 23 04/19/2013    No results found for this basename: HGBA1C   Lipid Panel  No results found for this basename: chol, trig, hdl, cholhdl, vldl, ldlcalc       Assessment and plan:   Patient  Active Problem List   Diagnosis Date Noted  . History of breast cancer in female 04/30/2013  . Insomnia 04/30/2013  . Obstructive sleep apnea 04/30/2013  . Metastatic cancer 04/30/2013  . COPD (chronic obstructive pulmonary disease) 04/30/2013  . Essential hypertension, benign 04/30/2013  . Chronic kidney disease (CKD), stage IV (severe) 04/30/2013  . Hoarseness of voice 04/30/2013  . Vocal cord paralysis 04/30/2013  . COPD exacerbation 04/15/2013  . CKD (chronic kidney disease), stage III 04/15/2013  . HTN (hypertension) 04/15/2013  . CAD (coronary artery disease) 04/15/2013    Plan: Ambulatory referral to nephrologist for CKD in the setting of only one kidney Ambulatory referral to pulmonologist for multiple pulmonary nodules Ambulatory referral to ENT for vocal cord paralysis Polysomnography  Lunesta 3 mg tablet by mouth each bedtime prescribed  Patient has been extensively counseled about diagnosis and other possibilities Patient encouraged to followup with oncologist as scheduled      Health Maintenance  -Vaccinations:  -Influenza given today  Follow up in  4 weeks or when necessary   The patient was given clear instructions to go to ER or return to medical center if symptoms don't improve, worsen or new problems develop. The patient verbalized understanding. The patient was told to call to get lab results if they haven't heard anything in the next week.     Jeanann Lewandowsky, MD, MHA, [facility] [place], [place] [phone]   04/30/2013, 11:34 AM

## 2013-05-03 ENCOUNTER — Telehealth: Payer: Self-pay | Admitting: *Deleted

## 2013-05-03 ENCOUNTER — Telehealth: Payer: Self-pay | Admitting: Oncology

## 2013-05-03 NOTE — Telephone Encounter (Signed)
PT.'S DAUGHTER WAS UNSURE IF SPUTUM HAS ANY COLOR. PT. IS ALSO WHEEZING. SHE GETS SHORT OF BREATH WHEN COUGHING AND WALKING. PT. HAS A TEMPERATURE OF 99.7. PT. SAW DR.JEGEDE [PCP] FOR THE FIRST TIME ON 04/30/13. THIS NOTE TO DR.KHAN'S NURSE, MEREDITH WALTON,RN.

## 2013-05-03 NOTE — Telephone Encounter (Signed)
LVMM adv on 07/29/13 appts cal mailed shh

## 2013-05-04 ENCOUNTER — Telehealth: Payer: Self-pay | Admitting: Emergency Medicine

## 2013-05-04 NOTE — Telephone Encounter (Signed)
Per patient's daughter, patient is feeling better today. Patient has a home health nurse who will be out to see her tomorrow. Family states they gave her Delsym cough syrup, which seems to have helped. Advised patient's daughter to call for any future questions or concerns. Daughter verbalized understanding.

## 2013-05-12 ENCOUNTER — Ambulatory Visit (INDEPENDENT_AMBULATORY_CARE_PROVIDER_SITE_OTHER): Payer: Medicare Other | Admitting: Pulmonary Disease

## 2013-05-12 ENCOUNTER — Encounter: Payer: Self-pay | Admitting: Pulmonary Disease

## 2013-05-12 VITALS — BP 132/80 | HR 84 | Temp 97.9°F | Ht 63.25 in | Wt 143.4 lb

## 2013-05-12 DIAGNOSIS — J3800 Paralysis of vocal cords and larynx, unspecified: Secondary | ICD-10-CM

## 2013-05-12 DIAGNOSIS — J449 Chronic obstructive pulmonary disease, unspecified: Secondary | ICD-10-CM

## 2013-05-12 DIAGNOSIS — J4489 Other specified chronic obstructive pulmonary disease: Secondary | ICD-10-CM

## 2013-05-12 NOTE — Assessment & Plan Note (Signed)
The patient has vocal cord paralysis and is being followed by otolaryngology.  This is clearly increasing her airway resistance and contributing to her shortness of breath.  She has a followup scheduled with ENT, and we'll have to consider some type of surgery or a tracheostomy.

## 2013-05-12 NOTE — Assessment & Plan Note (Signed)
The patient has moderate airflow obstruction by her spirometry, most likely secondary to emphysema.  She is on an excellent bronchodilator regimen, but is concerned about the inconvenience.  We can consider in the future changing her over to more long-acting nebulized medications.  For now, she has a lot going on, and I would like to keep her on her current meds.  I do not think that she will do well on metered-dose inhaler or dry powder with her underlying vocal cord issue.

## 2013-05-12 NOTE — Progress Notes (Signed)
  Subjective:    Patient ID: Danielle Bullock, female    DOB: Oct 15, 1921, 79 y.o.   MRN: [id number]  HPI The patient is an 79 year old female who been asked to see for management of COPD.  She recently moved from [place], and apparently was seeing a pulmonologist there for COPD.  However, she does not ever remember having pulmonary function studies.  She currently is on a good bronchodilator regimen with a nebulizer, but has significant dyspnea on exertion.  However, it is no different from 6 months ago.  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  She clearly struggles to talk during our visit because of vocal cord issues.  She has seen otolaryngology, and discussions have taken place about a possible tracheostomy.  The patient states that she will get winded just walking through her home, but she can do okay shopping if she paces herself.  She has a dry cough that sounds more upper airway in origin.  She has not had any oxygenation issues.  She has had a recent ventilation perfusion scan that was unremarkable.  The concomitant chest x-ray showed no acute process.   Review of Systems  Constitutional: Positive for unexpected weight change. Negative for fever.  HENT: Positive for ear pain and sneezing. Negative for congestion, dental problem, nosebleeds, postnasal drip, rhinorrhea, sinus pressure, sore throat and trouble swallowing.   Eyes: Negative for redness and itching.  Respiratory: Positive for cough and shortness of breath. Negative for chest tightness and wheezing.   Cardiovascular: Negative for palpitations and leg swelling.  Gastrointestinal: Negative for nausea and vomiting.  Genitourinary: Negative for dysuria.  Musculoskeletal: Positive for arthralgias and joint swelling.  Skin: Negative for rash.  Neurological: Positive for headaches.  Hematological: Does not bruise/bleed easily.   Psychiatric/Behavioral: Negative for dysphoric mood. The patient is not nervous/anxious.        Objective:   Physical Exam Constitutional:  Well developed, no acute distress.  Noisy breathing due to VC issues.  HENT:  Nares patent without discharge  Oropharynx without exudate, palate and uvula are normal  Eyes:  Perrla, eomi, no scleral icterus  Neck:  No JVD, no TMG  Cardiovascular:  Normal rate, regular rhythm, no rubs or gallops.  No murmurs        Intact distal pulses  Pulmonary :  Normal breath sounds, no respiratory distress, loud upper airway noise with transmission to lower lung zones.    No rales, rhonchi, or wheezing  Abdominal:  Soft, nondistended, bowel sounds present.  No tenderness noted.   Musculoskeletal:  mild lower extremity edema noted.  Lymph Nodes:  No cervical lymphadenopathy noted  Skin:  No cyanosis noted  Neurologic:  Alert, appropriate, moves all 4 extremities without obvious deficit.         Assessment & Plan:

## 2013-05-12 NOTE — Patient Instructions (Addendum)
You have mild to moderate copd by your breathing studies today. Would continue on your nebulizer medications as follows:   Take albuterol and ipratropium at breakfast, lunch, dinner, and bedtime          Add budesonide to the morning and evening treatments.          Can take 2 additional treatments of the albuterol/ipratropium if having a bad day.  Can use albuterol inhaler for rescue when away from home. Keep apptm with ENT for further treatment of your vocal cord issue.  followup with me in 6mos

## 2013-05-17 ENCOUNTER — Telehealth: Payer: Self-pay | Admitting: Pulmonary Disease

## 2013-05-17 MED ORDER — IPRATROPIUM BROMIDE 0.02 % IN SOLN: 0.5000 mg | Freq: Four times a day (QID) | RESPIRATORY_TRACT | Status: DC | PRN

## 2013-05-17 NOTE — Telephone Encounter (Signed)
Spoke to pt's daughter. Rx for her neb medicines have been sent in.  Per KC pt was to stop her Inderal and contact her PCP about changing this. The pt's daughter states that they can't get in touch with their office. They are wanting KC to change the medication. Advise her that [facility] might not do this since this is a med prescribed by her PCP.  KC - please advise on Inderal change. Thanks.

## 2013-05-17 NOTE — Telephone Encounter (Signed)
She cannot just stop inderal.  It either has to be weaned, or it has to be replaced with another medication in that family.  This must be done by her primary.  If she is unable to contact her primary reliably, may need to consider a different primary physician.  It is not something that is urgent, but rather should be done in next month or so.

## 2013-05-17 NOTE — Telephone Encounter (Signed)
I spoke with daughter and isa ware of recs. Nothing further needed

## 2013-05-19 ENCOUNTER — Telehealth: Payer: Self-pay | Admitting: *Deleted

## 2013-05-19 NOTE — Telephone Encounter (Signed)
Call from [facility] RN- pt's brother died in [place]. Pt will be out of town due to death in the family, will miss 1-2 wks of home health visits.message forward to provider for review. Next f/u 07/09/13

## 2013-05-19 NOTE — Telephone Encounter (Signed)
ook

## 2013-05-31 ENCOUNTER — Ambulatory Visit (HOSPITAL_BASED_OUTPATIENT_CLINIC_OR_DEPARTMENT_OTHER): Payer: Medicare Other | Attending: Internal Medicine

## 2013-05-31 VITALS — Ht 63.0 in | Wt 143.0 lb

## 2013-05-31 DIAGNOSIS — G4761 Periodic limb movement disorder: Secondary | ICD-10-CM | POA: Insufficient documentation

## 2013-05-31 DIAGNOSIS — G4700 Insomnia, unspecified: Secondary | ICD-10-CM

## 2013-05-31 DIAGNOSIS — G4733 Obstructive sleep apnea (adult) (pediatric): Secondary | ICD-10-CM

## 2013-05-31 DIAGNOSIS — G4710 Hypersomnia, unspecified: Secondary | ICD-10-CM | POA: Insufficient documentation

## 2013-06-02 ENCOUNTER — Emergency Department (HOSPITAL_COMMUNITY)
Admission: EM | Admit: 2013-06-02 | Discharge: 2013-06-02 | Disposition: A | Discharge status: Home / Self Care | Payer: Medicare Other | Attending: Emergency Medicine | Admitting: Emergency Medicine

## 2013-06-02 ENCOUNTER — Emergency Department (HOSPITAL_COMMUNITY): Payer: Medicare Other

## 2013-06-02 ENCOUNTER — Telehealth: Payer: Self-pay | Admitting: Emergency Medicine

## 2013-06-02 ENCOUNTER — Telehealth: Payer: Self-pay | Admitting: Internal Medicine

## 2013-06-02 ENCOUNTER — Encounter (HOSPITAL_COMMUNITY): Payer: Self-pay | Admitting: Emergency Medicine

## 2013-06-02 DIAGNOSIS — I10 Essential (primary) hypertension: Secondary | ICD-10-CM

## 2013-06-02 DIAGNOSIS — Z87891 Personal history of nicotine dependence: Secondary | ICD-10-CM | POA: Insufficient documentation

## 2013-06-02 DIAGNOSIS — R519 Headache, unspecified: Secondary | ICD-10-CM

## 2013-06-02 DIAGNOSIS — W19XXXA Unspecified fall, initial encounter: Secondary | ICD-10-CM | POA: Insufficient documentation

## 2013-06-02 DIAGNOSIS — IMO0002 Reserved for concepts with insufficient information to code with codable children: Secondary | ICD-10-CM | POA: Insufficient documentation

## 2013-06-02 DIAGNOSIS — J4489 Other specified chronic obstructive pulmonary disease: Secondary | ICD-10-CM | POA: Insufficient documentation

## 2013-06-02 DIAGNOSIS — I509 Heart failure, unspecified: Secondary | ICD-10-CM | POA: Insufficient documentation

## 2013-06-02 DIAGNOSIS — Z79899 Other long term (current) drug therapy: Secondary | ICD-10-CM | POA: Insufficient documentation

## 2013-06-02 DIAGNOSIS — J449 Chronic obstructive pulmonary disease, unspecified: Secondary | ICD-10-CM | POA: Insufficient documentation

## 2013-06-02 DIAGNOSIS — Z7982 Long term (current) use of aspirin: Secondary | ICD-10-CM | POA: Insufficient documentation

## 2013-06-02 DIAGNOSIS — S0990XA Unspecified injury of head, initial encounter: Secondary | ICD-10-CM | POA: Insufficient documentation

## 2013-06-02 DIAGNOSIS — Y929 Unspecified place or not applicable: Secondary | ICD-10-CM | POA: Insufficient documentation

## 2013-06-02 DIAGNOSIS — C801 Malignant (primary) neoplasm, unspecified: Secondary | ICD-10-CM | POA: Insufficient documentation

## 2013-06-02 DIAGNOSIS — Y9389 Activity, other specified: Secondary | ICD-10-CM | POA: Insufficient documentation

## 2013-06-02 HISTORY — DX: Essential (primary) hypertension: I10

## 2013-06-02 LAB — BASIC METABOLIC PANEL
BUN: 13 mg/dL (ref 6–23)
CO2: 24 mEq/L (ref 19–32)
Calcium: 9.5 mg/dL (ref 8.4–10.5)
GFR calc non Af Amer: 35 mL/min — ABNORMAL LOW (ref >90)
Glucose, Bld: 95 mg/dL (ref 70–99)
Sodium: 139 mEq/L (ref 135–145)

## 2013-06-02 LAB — CBC
HCT: 43.9 % (ref 36.0–46.0)
Hemoglobin: 14.7 g/dL (ref 12.0–15.0)
MCH: 31.1 pg (ref 26.0–34.0)
MCHC: 33.5 g/dL (ref 30.0–36.0)
MCV: 93 fL (ref 78.0–100.0)
RBC: 4.72 MIL/uL (ref 3.87–5.11)

## 2013-06-02 MED ORDER — HYDROCODONE-ACETAMINOPHEN 5-325 MG PO TABS: 1.0000 | ORAL_TABLET | Freq: Three times a day (TID) | ORAL | Status: DC | PRN

## 2013-06-02 MED ORDER — ONDANSETRON HCL 4 MG/2ML IJ SOLN
4.0000 mg | Freq: Once | INTRAMUSCULAR | Status: AC
  Administered 2013-06-02: 4 mg via INTRAVENOUS
  Filled 2013-06-02: qty 2

## 2013-06-02 MED ORDER — MORPHINE SULFATE 2 MG/ML IJ SOLN
2.0000 mg | Freq: Once | INTRAMUSCULAR | Status: AC
  Administered 2013-06-02: 2 mg via INTRAVENOUS
  Filled 2013-06-02: qty 1

## 2013-06-02 MED ORDER — IOHEXOL 300 MG/ML  SOLN
100.0000 mL | Freq: Once | INTRAMUSCULAR | Status: AC | PRN
  Administered 2013-06-02: 100 mL via INTRAVENOUS

## 2013-06-02 MED ORDER — SODIUM CHLORIDE 0.9 % IV SOLN
1000.0000 mL | INTRAVENOUS | Status: DC
  Administered 2013-06-02: 1000 mL via INTRAVENOUS

## 2013-06-02 NOTE — ED Notes (Signed)
Pt stage 4 breast cancer pt coming from home c/o HTN and headache since Sunday. Pt fell on Saturday hitting her head and mouth. Pt take daily ASA and plavix.  Pt Hospice nurse called stating pt was coming in to be seen and BP 202-97 on Monday and today BP 188/100. Pt was told by her EENT to double up on her albuterol treatments that she started doing two weeks ago so pt's daughter believes the HTN and headaches are related to that.

## 2013-06-02 NOTE — ED Provider Notes (Signed)
CSN: [id number]     Arrival date & time 06/02/13  1146 History   None    Chief Complaint  Patient presents with  . Hypertension  . Headache   (Consider location/radiation/quality/duration/timing/severity/associated sxs/prior Treatment) HPI Comments: 79 yo female with hx of metastatic breast cancer, currently only taking femara--declines other chemo-- HTN, CHF, COPD (with recent admission for bronchitis flare) who presents with c/o persistent worsening headaches over the past 7-10 days. Reports that the headaches awake her from sleep in the middle of the night and seem worse in the morning. Has taken her BP at that time and found it a bit elevated, SBP around 180. Blood pressure reduces after she takes her morning meds to SBP=150. Reports also increasing her albuterol/atrovent treatments from BID to QID recently which has helped her breathing, but she wonders if this has any connection with her headaches. Of note, she had a mechanical fall about 4 days ago, and struck her head, fell on her right side. Denies LOC. Denies fever, chills, chest pain, N/V/D, weakness and dizziness.  The history is provided by the patient and a relative.    Past Medical History  Diagnosis Date  . Cancer     breast/lung/liver  . Cancer, metastatic   . COPD (chronic obstructive pulmonary disease)   . CHF (congestive heart failure)   . Complication of anesthesia     hard to wake up   Past Surgical History  Procedure Laterality Date  . Thyroid surgery    . Cardiac surgery    . Abdominal surgery    . Breast surgery    . Small intestine surgery     Family History  Problem Relation Age of Onset  . Hypertension Father   . Heart disease Father   . Hypertension Sister   . Hypertension Brother    History  Substance Use Topics  . Smoking status: Former Smoker -- 1.00 packs/day for 45 years    Types: Cigarettes    Quit date: 04/15/1969  . Smokeless tobacco: Never Used  . Alcohol Use: No   OB History    Grav Para Term Preterm Abortions TAB SAB Ect Mult Living                 Review of Systems  Constitutional: Negative for fever.  HENT: Negative for rhinorrhea and sore throat.   Eyes: Negative for pain and visual disturbance.  Respiratory: Negative for cough and shortness of breath.   Cardiovascular: Negative for chest pain and palpitations.  Gastrointestinal: Negative for vomiting and abdominal pain.  Genitourinary: Negative for difficulty urinating.  Musculoskeletal: Positive for arthralgias.  Skin: Negative for rash.  Neurological: Positive for headaches. Negative for dizziness, syncope, speech difficulty, weakness and light-headedness.  Hematological: Negative for adenopathy.  Psychiatric/Behavioral: Negative for agitation.    Allergies  Review of patient's allergies indicates no known allergies.  Home Medications   Current Outpatient Rx  Name  Route  Sig  Dispense  Refill  . albuterol (PROAIR HFA) 108 (90 BASE) MCG/ACT inhaler   Inhalation   Inhale 2 puffs into the lungs every 4 (four) hours as needed for wheezing or shortness of breath.   1 Inhaler   3   . albuterol (PROVENTIL) (5 MG/ML) 0.5% nebulizer solution   Nebulization   Take 0.5 mLs (2.5 mg total) by nebulization every 6 (six) hours as needed for wheezing or shortness of breath.   20 mL   3   . aspirin 325 MG tablet  Oral   Take 325 mg by mouth daily.         . budesonide (PULMICORT) 0.5 MG/2ML nebulizer solution   Nebulization   Take 2 mLs (0.5 mg total) by nebulization 2 (two) times daily.   2 mL   3   . Calcium Carbonate (CALCIUM 600 PO)   Oral   Take 1,200 mg by mouth daily.          . Cholecalciferol (VITAMIN D3) 2000 UNITS TABS   Oral   Take 2,000 Int'l Units by mouth daily.         . clopidogrel (PLAVIX) 75 MG tablet   Oral   Take 1 tablet (75 mg total) by mouth daily.   30 tablet   3   . Eszopiclone (ESZOPICLONE) 3 MG TABS   Oral   Take 1 tablet (3 mg total) by mouth at  bedtime. Take immediately before bedtime when necessary for insomnia   90 tablet   3   . ezetimibe (ZETIA) 10 MG tablet   Oral   Take 1 tablet (10 mg total) by mouth daily.   30 tablet   3   . furosemide (LASIX) 40 MG tablet   Oral   Take 1 tablet (40 mg total) by mouth daily.   30 tablet   3   . HYDROcodone-acetaminophen (NORCO/VICODIN) 5-325 MG per tablet   Oral   Take 1-2 tablets by mouth every 4 (four) hours as needed.   30 tablet   0   . ipratropium (ATROVENT) 0.02 % nebulizer solution   Nebulization   Take 2.5 mLs (0.5 mg total) by nebulization every 6 (six) hours as needed.   300 mL   3   . letrozole (FEMARA) 2.5 MG tablet   Oral   Take 1 tablet (2.5 mg total) by mouth daily.   90 tablet   6   . lovastatin (MEVACOR) 40 MG tablet   Oral   Take 80 mg by mouth at bedtime.         Marland Kitchen OVER THE COUNTER MEDICATION   Apply externally   Apply 1 application topically daily as needed (for pain).         . potassium chloride SA (K-DUR,KLOR-CON) 20 MEQ tablet   Oral   Take 20 mEq by mouth daily.         . propranolol (INDERAL) 60 MG tablet   Oral   Take 1 tablet (60 mg total) by mouth daily.   30 tablet   3   . Sennosides (SENNA LAX PO)   Oral   Take by mouth.         . sodium bicarbonate 650 MG tablet   Oral   Take 650 mg by mouth 2 (two) times daily.          BP 174/85  Pulse 74  Temp(Src) 97.7 F (36.5 C)  Resp 22  SpO2 100% Physical Exam  Nursing note and vitals reviewed. Constitutional: She is oriented to person, place, and time. She appears well-developed and well-nourished.  HENT:  Head: Normocephalic and atraumatic.  Right Ear: External ear normal.  Left Ear: External ear normal.  Eyes: EOM are normal.  Neck: Normal range of motion.  Cardiovascular: Normal rate, regular rhythm, normal heart sounds and intact distal pulses.   Pulmonary/Chest: Effort normal. She has wheezes (intermittantly).  Abdominal: Soft. Bowel sounds are  normal. There is no tenderness.  Musculoskeletal: Normal range of motion.  Right hip: Normal.  Neurological: She is alert and oriented to person, place, and time. She has normal strength. No sensory deficit. Coordination and gait normal.  Skin: Skin is warm and dry.  Psychiatric: She has a normal mood and affect.    ED Course  Procedures (including critical care time) Labs Review Labs Reviewed  BASIC METABOLIC PANEL - Abnormal; Notable for the following:    Creatinine, Ser 1.30 (*)    GFR calc non Af Amer 35 (*)    GFR calc Af Amer 41 (*)    All other components within normal limits  CBC   Imaging Review Dg Chest 2 View  06/02/2013   CLINICAL DATA:  Headache, shortness of Breath  EXAM: CHEST  2 VIEW  COMPARISON:  04/15/2013  FINDINGS: Cardiomediastinal silhouette is stable. Elevation of the left hemidiaphragm again noted. Surgical clips in left axilla. No pulmonary edema. Status post median sternotomy. Atherosclerotic calcifications of thoracic aorta. Streaky left basilar atelectasis or scarring. Bony thorax is stable.  IMPRESSION: No pulmonary edema. Status post median sternotomy. Elevation of left hemidiaphragm again noted. Streaky left basilar atelectasis or scarring.   Electronically Signed   By: Natasha Mead M.D.   On: 06/02/2013 13:44   Ct Head W Wo Contrast  06/02/2013   CLINICAL DATA:  Pain post trauma; history of breast carcinoma  EXAM: CT HEAD WITHOUT AND WITH CONTRAST  TECHNIQUE: Contiguous axial images were obtained from the base of the skull through the vertex without and with intravenous contrast  CONTRAST:  OMNIPAQUE IOHEXOL 300 MG/ML  SOLN  COMPARISON:  None.  FINDINGS: There is mild diffuse atrophy. There is no mass, hemorrhage, extra-axial fluid collection, or midline shift. There is patchy small vessel disease throughout the centra semiovale bilaterally. Elsewhere, gray-white compartments appear normal. No acute infarct is apparent. No enhancing lesions are  identified.  The bony calvarium appears intact.  The mastoid air cells are clear.  IMPRESSION: Mild atrophy with patchy supratentorial small vessel disease. No intracranial mass, hemorrhage, acute infarct, or enhancing lesion.   Electronically Signed   By: Bretta Bang M.D.   On: 06/02/2013 14:08    EKG Interpretation    Date/Time:  Wednesday June 02 2013 12:57:20 EST Ventricular Rate:  64 PR Interval:  281 QRS Duration: 80 QT Interval:  419 QTC Calculation: 432 R Axis:   -33 Text Interpretation:  Second degree AV block, Mobitz I Low voltage, precordial leads     left ventricular hypertrophy by voltage criteria in aVL Borderline T abnormalities, diffuse leads Confirmed by Juleen China  MD, STEPHEN ([id number]) on 06/02/2013 1:17:53 PM            MDM   1. Hypertension   2. Headache    Shared visit with Dr. Juleen China. 79 yo female with metastatic breast cancer presents with headaches. Neuro exam reveals no focal findings. Recent fall, consider intracranial bleeding. Headaches wake her from sleep and are worse in the morning, concerning for mets to the brain as well. CT with contrast obtained, no evidence of brain mets, no bleeding. Labs significant for mildly bumped creatinine, otherwise WNL. Hydrated prior to scan. EKG reveals mobitz block 3:1 conduction but asymptomatic. Denies syncope, chest pain. No emergent cardiology consult indicated. BP mildly elevated here at 160/80. Headache resolved after morphine. Arranged a f/u with a PCP tomorrow to discuss her BP management. Felt stable for d/c. Patient agrees with plan.    Simmie Davies, NP 06/02/13 1525

## 2013-06-02 NOTE — Progress Notes (Signed)
   CARE MANAGEMENT ED NOTE 06/02/2013  Patient:  Danielle, Bullock   Account Number:  [id number]  Date Initiated:  06/02/2013  Documentation initiated by:  Dennis Bast Assessment:   79 yr old [place]are[place] pt stage 4 breast cancer pt coming from home c/o HTN and headache since Sunday. Pt fell on Saturday hitting her head & mouth. Pt take daily ASA 7 plavix.     Subjective/Objective Assessment Detail:   Pt Hospice nurse called stating pt was coming in to be seen and BP 202-97 on Monday and today BP 188/100. Pt was told by her EENT to double up on her albuterol treatments that she started doing two weeks ago so pt's daughter believes the HTN and headaches are related to that  Pt listed in EPIC for an appointment 06/10/13 at the adult care center with [name] (pcp) vs end of January as pt stated  Request for another pcp & earlier appointment to f/u bp issues     Action/Plan:   [facility] ED Cm spoke with ED RN, [name], ED NP CM provided a list of medicare providers accepting new patients within the [place] code of [address] (listed for pt in EPIC) CM spoke with [facility] staff, [name] about an earlier appointment   Action/Plan Detail:   Cm spoke with pt's daughter at bedside to inquire about need of earlier appointment, appointment time [name] agreed to 11 am 06/03/13 appointment with [name] see below note ED NP & ED RN updated CM entered appointment information in EPIC   Anticipated DC Date:  06/02/2013     Status Recommendation to Physician:   Result of Recommendation:    Other ED Services  Consult Working Plan    DC Planning Services  Other  PCP issues  Outpatient Services - Pt will follow up    Choice offered to / List presented to:  C-4 Adult Children          Status of service:  Completed, signed off  ED Comments:   ED Comments Detail:  CM confirmed an appointment with [name] for [name]  On 06/03/2013 appointment time is 11  am 06/03/13 but will need to be there at 1030 related new patient with all identification cards for medicare, license, etc  [facility] [address] [place], [name] [address] [phone]

## 2013-06-02 NOTE — ED Notes (Signed)
Patient transported to CT 

## 2013-06-02 NOTE — ED Notes (Signed)
Pt's daughter sts Pt is supposed to be changed to a different HTN medication, but she doesn't see her PCP until the end of January.

## 2013-06-02 NOTE — Telephone Encounter (Signed)
[facility] Ramiro Harvest called urgent in regards to Danielle Bullock  Status. States over the weekend Danielle Bullock BP 240/120 with c/o 8/10 headaches,dizziness. Thayer Ohm also reprts Danielle Bullock fell and hit right side of head. Danielle Bullock was sent to [facility] ER via EMS today. Will continue to monitor Danielle Bullock status

## 2013-06-02 NOTE — ED Notes (Signed)
Pt escorted to discharge window. Verbalized understanding discharge instructions. In no acute distress.   

## 2013-06-02 NOTE — ED Notes (Signed)
NP at bedside.

## 2013-06-02 NOTE — ED Provider Notes (Signed)
Medical screening examination/treatment/procedure(s) were conducted as a shared visit with non-physician practitioner(s) and myself.  I personally evaluated the patient during the encounter.  EKG Interpretation    Date/Time:  Wednesday June 02 2013 12:57:20 EST Ventricular Rate:  64 PR Interval:  281 QRS Duration: 80 QT Interval:  419 QTC Calculation: 432 R Axis:   -33 Text Interpretation:  Second degree AV block, Mobitz I Low voltage, precordial leads     left ventricular hypertrophy by voltage criteria in aVL Borderline T abnormalities, diffuse leads Confirmed by Juleen China  MD, Emryn Flanery ([id number]) on 06/02/2013 1:17:53 PM           79 year old female with intermittent headaches. Her neuro exam is nonfocal. History of metastatic cancer. CT of the head does not show any evidence of metastasis. Her headache is currently resolved. She's been having ongoing issues with poorly controlled blood pressure. There does seem to be some correlation between her headaches and how high her blood pressure is. She is not encephalopathic. She reports she has appointment tomorrow with her doctor. Followup and to discuss further blood pressure medication recommendations. Of note, her EKG shows a Mobitz type I heart block. This appears to be new. She appears to be asymptomatic with regards to this. She denies any dizziness, lightheadedness or shortness of breath. Emergent return precautions were discussed.  Raeford Razor, MD 06/02/13 [phone]

## 2013-06-02 NOTE — Telephone Encounter (Signed)
Ramiro Harvest, [facility], needing to speak to speak to someone urgently about pt, currently with pt. Please f/u with nurse.

## 2013-06-02 NOTE — Telephone Encounter (Signed)
Spoke with Danielle Bullock regarding update of pt status. Pt sent to ER. Information updated in chart

## 2013-06-05 DIAGNOSIS — G4733 Obstructive sleep apnea (adult) (pediatric): Secondary | ICD-10-CM

## 2013-06-05 DIAGNOSIS — G4700 Insomnia, unspecified: Secondary | ICD-10-CM

## 2013-06-05 NOTE — Sleep Study (Signed)
[facility]  NAME: Danielle Bullock DATE OF BIRTH:  1922/05/28 MEDICAL RECORD NUMBER [id number]  LOCATION: [facility]  PHYSICIAN: YOUNG,CLINTON D  DATE OF STUDY: 05/31/2013  SLEEP STUDY TYPE: Nocturnal Polysomnogram               REFERRING PHYSICIAN: Jeanann Lewandowsky, MD  INDICATION FOR STUDY: Hypersomnia with sleep apnea  EPWORTH SLEEPINESS SCORE:   4/24 HEIGHT: 5\' 3"  (160 cm)  WEIGHT: 143 lb (64.864 kg)    Body mass index is 25.34 kg/(m^2).  NECK SIZE: 13 in.  MEDICATIONS: Charted for review  SLEEP ARCHITECTURE: Total sleep time 222.5 minutes with sleep efficiency 61.5%. Stage I was 11.2%, stage II 60%, stage III and absent, REM 28.8% of total sleep time. Sleep latency 15 minutes, REM latency 88.5 minutes. Awake after sleep onset 106 minutes. Arousal index 17.8. Bedtime medication: Zetia, sodium bicarbonate, lovastatin, laxative, Lunesta, calcium.  RESPIRATORY DATA: Apnea hypopnea index (AHI) 0.3 per hour. A single obstructive apnea was recorded while nonsupine. CPAP titration was not indicated.  OXYGEN DATA: Moderate snoring with oxygen desaturation to a nadir of 90% and mean oxygen saturation through the study of 94.1% on room air.  CARDIAC DATA: Sinus rhythm with PACs  MOVEMENT/PARASOMNIA: A total of 91 limb jerks were counted of which 22 were associated with arousals or awakenings for periodic limb movement with arousal index of 5.9 per hour. Bathroom x1.   IMPRESSION/ RECOMMENDATION: 1) rare respiratory events with sleep disturbance, within normal limits. AHI 0.3 per hour(the normal range for adults is an AHI between 0 and 5 events per hour). Moderate snoring with oxygen desaturation to a nadir of 90% and mean oxygen saturation through the study of 94.1% on room air. 2) periodic limb movement with arousal syndrome. A total of 91 limb jerks were counted of which 22 were associated with arousal or awakenings for periodic limb movement  with arousal index of 5.9 per hour. If clinically appropriate, a therapeutic trial of specific therapy such as Requip or Mirapex might be considered.  Signed Jetty Duhamel M.D. Waymon Budge Diplomate, American Board of Sleep Medicine  ELECTRONICALLY SIGNED ON:  06/05/2013, 9:59 AM [facility] PH: [phone]   FX: [phone] ACCREDITED BY THE AMERICAN ACADEMY OF SLEEP MEDICINE

## 2013-06-10 ENCOUNTER — Ambulatory Visit: Payer: Medicare Other | Admitting: Internal Medicine

## 2013-06-15 ENCOUNTER — Telehealth: Payer: Self-pay | Admitting: Pulmonary Disease

## 2013-06-15 MED ORDER — ALBUTEROL SULFATE (5 MG/ML) 0.5% IN NEBU: 2.5000 mg | INHALATION_SOLUTION | Freq: Four times a day (QID) | RESPIRATORY_TRACT | Status: DC | PRN

## 2013-06-15 MED ORDER — IPRATROPIUM BROMIDE 0.02 % IN SOLN: 0.5000 mg | Freq: Four times a day (QID) | RESPIRATORY_TRACT | Status: DC | PRN

## 2013-06-15 MED ORDER — BUDESONIDE 0.5 MG/2ML IN SUSP: 0.5000 mg | Freq: Two times a day (BID) | RESPIRATORY_TRACT | Status: DC

## 2013-06-15 NOTE — Telephone Encounter (Signed)
[facility] calling again saying that pt's medicare part B will pay for rx but they need a need rx with a dx code can be reached at [phone].Raylene Everts

## 2013-06-15 NOTE — Telephone Encounter (Signed)
RX's have been resent with DX code. Nothing further needed

## 2013-06-15 NOTE — Telephone Encounter (Signed)
Calling back asking for samples.

## 2013-06-28 ENCOUNTER — Inpatient Hospital Stay (HOSPITAL_COMMUNITY)
Admission: EM | Admit: 2013-06-28 | Discharge: 2013-07-02 | DRG: 641 | Disposition: A | Discharge status: Hospice | Payer: Medicare Other | Attending: Internal Medicine | Admitting: Internal Medicine

## 2013-06-28 ENCOUNTER — Emergency Department (HOSPITAL_COMMUNITY): Payer: Medicare Other

## 2013-06-28 ENCOUNTER — Encounter (HOSPITAL_COMMUNITY): Payer: Self-pay | Admitting: Emergency Medicine

## 2013-06-28 DIAGNOSIS — C801 Malignant (primary) neoplasm, unspecified: Secondary | ICD-10-CM

## 2013-06-28 DIAGNOSIS — Z87891 Personal history of nicotine dependence: Secondary | ICD-10-CM

## 2013-06-28 DIAGNOSIS — C787 Secondary malignant neoplasm of liver and intrahepatic bile duct: Secondary | ICD-10-CM | POA: Diagnosis present

## 2013-06-28 DIAGNOSIS — I129 Hypertensive chronic kidney disease with stage 1 through stage 4 chronic kidney disease, or unspecified chronic kidney disease: Secondary | ICD-10-CM | POA: Diagnosis present

## 2013-06-28 DIAGNOSIS — Z8249 Family history of ischemic heart disease and other diseases of the circulatory system: Secondary | ICD-10-CM

## 2013-06-28 DIAGNOSIS — I509 Heart failure, unspecified: Secondary | ICD-10-CM | POA: Diagnosis present

## 2013-06-28 DIAGNOSIS — Z79899 Other long term (current) drug therapy: Secondary | ICD-10-CM

## 2013-06-28 DIAGNOSIS — J4489 Other specified chronic obstructive pulmonary disease: Secondary | ICD-10-CM | POA: Diagnosis present

## 2013-06-28 DIAGNOSIS — J383 Other diseases of vocal cords: Secondary | ICD-10-CM | POA: Diagnosis present

## 2013-06-28 DIAGNOSIS — N179 Acute kidney failure, unspecified: Secondary | ICD-10-CM

## 2013-06-28 DIAGNOSIS — Z515 Encounter for palliative care: Secondary | ICD-10-CM

## 2013-06-28 DIAGNOSIS — Z7982 Long term (current) use of aspirin: Secondary | ICD-10-CM

## 2013-06-28 DIAGNOSIS — E876 Hypokalemia: Principal | ICD-10-CM

## 2013-06-28 DIAGNOSIS — I10 Essential (primary) hypertension: Secondary | ICD-10-CM | POA: Diagnosis present

## 2013-06-28 DIAGNOSIS — Z66 Do not resuscitate: Secondary | ICD-10-CM | POA: Diagnosis not present

## 2013-06-28 DIAGNOSIS — Z7902 Long term (current) use of antithrombotics/antiplatelets: Secondary | ICD-10-CM

## 2013-06-28 DIAGNOSIS — R1310 Dysphagia, unspecified: Secondary | ICD-10-CM

## 2013-06-28 DIAGNOSIS — J449 Chronic obstructive pulmonary disease, unspecified: Secondary | ICD-10-CM

## 2013-06-28 DIAGNOSIS — R531 Weakness: Secondary | ICD-10-CM

## 2013-06-28 DIAGNOSIS — N184 Chronic kidney disease, stage 4 (severe): Secondary | ICD-10-CM

## 2013-06-28 DIAGNOSIS — I2510 Atherosclerotic heart disease of native coronary artery without angina pectoris: Secondary | ICD-10-CM | POA: Diagnosis present

## 2013-06-28 DIAGNOSIS — J3800 Paralysis of vocal cords and larynx, unspecified: Secondary | ICD-10-CM

## 2013-06-28 DIAGNOSIS — N183 Chronic kidney disease, stage 3 unspecified: Secondary | ICD-10-CM

## 2013-06-28 DIAGNOSIS — I959 Hypotension, unspecified: Secondary | ICD-10-CM | POA: Diagnosis not present

## 2013-06-28 DIAGNOSIS — E860 Dehydration: Secondary | ICD-10-CM | POA: Diagnosis present

## 2013-06-28 DIAGNOSIS — Z951 Presence of aortocoronary bypass graft: Secondary | ICD-10-CM

## 2013-06-28 DIAGNOSIS — C50919 Malignant neoplasm of unspecified site of unspecified female breast: Secondary | ICD-10-CM | POA: Diagnosis present

## 2013-06-28 DIAGNOSIS — C799 Secondary malignant neoplasm of unspecified site: Secondary | ICD-10-CM | POA: Diagnosis present

## 2013-06-28 DIAGNOSIS — C7800 Secondary malignant neoplasm of unspecified lung: Secondary | ICD-10-CM | POA: Diagnosis present

## 2013-06-28 HISTORY — DX: Atherosclerotic heart disease of native coronary artery without angina pectoris: I25.10

## 2013-06-28 LAB — URINALYSIS, ROUTINE W REFLEX MICROSCOPIC
Glucose, UA: NEGATIVE mg/dL
Hgb urine dipstick: NEGATIVE
Ketones, ur: NEGATIVE mg/dL
Protein, ur: NEGATIVE mg/dL
Specific Gravity, Urine: 1.01 (ref 1.005–1.030)
Urobilinogen, UA: 0.2 mg/dL (ref 0.0–1.0)
pH: 7 (ref 5.0–8.0)

## 2013-06-28 LAB — HEPATIC FUNCTION PANEL
ALT: 25 U/L (ref 0–35)
AST: 34 U/L (ref 0–37)
Albumin: 4 g/dL (ref 3.5–5.2)
Alkaline Phosphatase: 83 U/L (ref 39–117)
Indirect Bilirubin: 0.6 mg/dL (ref 0.3–0.9)
Total Bilirubin: 0.8 mg/dL (ref 0.3–1.2)
Total Protein: 7.9 g/dL (ref 6.0–8.3)

## 2013-06-28 LAB — MAGNESIUM: Magnesium: 2.2 mg/dL (ref 1.5–2.5)

## 2013-06-28 LAB — CBC
MCH: 30.8 pg (ref 26.0–34.0)
MCHC: 34.3 g/dL (ref 30.0–36.0)
MCV: 89.9 fL (ref 78.0–100.0)
Platelets: 289 10*3/uL (ref 150–400)
RBC: 4.93 MIL/uL (ref 3.87–5.11)
RDW: 13.7 % (ref 11.5–15.5)
WBC: 6.2 10*3/uL (ref 4.0–10.5)

## 2013-06-28 LAB — URINE MICROSCOPIC-ADD ON

## 2013-06-28 LAB — BASIC METABOLIC PANEL
CO2: 30 mEq/L (ref 19–32)
Calcium: 9.7 mg/dL (ref 8.4–10.5)
Creatinine, Ser: 3.06 mg/dL — ABNORMAL HIGH (ref 0.50–1.10)
GFR calc non Af Amer: 13 mL/min — ABNORMAL LOW (ref >90)
Glucose, Bld: 99 mg/dL (ref 70–99)

## 2013-06-28 LAB — GLUCOSE, CAPILLARY: Glucose-Capillary: 91 mg/dL (ref 70–99)

## 2013-06-28 LAB — POTASSIUM: Potassium: 2.2 mEq/L — CL (ref 3.5–5.1)

## 2013-06-28 MED ORDER — ONDANSETRON HCL 4 MG PO TABS: 4.0000 mg | ORAL_TABLET | Freq: Four times a day (QID) | ORAL | Status: DC | PRN

## 2013-06-28 MED ORDER — SODIUM CHLORIDE 0.9 % IV SOLN: INTRAVENOUS | Status: DC

## 2013-06-28 MED ORDER — ALBUTEROL SULFATE (2.5 MG/3ML) 0.083% IN NEBU
2.5000 mg | INHALATION_SOLUTION | Freq: Four times a day (QID) | RESPIRATORY_TRACT | Status: DC | PRN
  Administered 2013-06-30 – 2013-07-01 (×3): 2.5 mg via RESPIRATORY_TRACT
  Filled 2013-06-28 (×3): qty 3

## 2013-06-28 MED ORDER — MORPHINE SULFATE 2 MG/ML IJ SOLN: 2.0000 mg | INTRAMUSCULAR | Status: DC | PRN

## 2013-06-28 MED ORDER — SODIUM CHLORIDE 0.9 % IJ SOLN
3.0000 mL | Freq: Two times a day (BID) | INTRAMUSCULAR | Status: DC
  Administered 2013-06-30 – 2013-07-02 (×5): 3 mL via INTRAVENOUS

## 2013-06-28 MED ORDER — ONDANSETRON HCL 4 MG/2ML IJ SOLN: 4.0000 mg | Freq: Three times a day (TID) | INTRAMUSCULAR | Status: DC | PRN

## 2013-06-28 MED ORDER — POTASSIUM CHLORIDE 10 MEQ/100ML IV SOLN
10.0000 meq | Freq: Once | INTRAVENOUS | Status: AC
  Administered 2013-06-28: 10 meq via INTRAVENOUS
  Filled 2013-06-28: qty 100

## 2013-06-28 MED ORDER — SODIUM CHLORIDE 0.9 % IV SOLN
INTRAVENOUS | Status: DC
  Administered 2013-06-28 – 2013-06-29 (×2): via INTRAVENOUS
  Filled 2013-06-28 (×4): qty 1000

## 2013-06-28 MED ORDER — ONDANSETRON HCL 4 MG/2ML IJ SOLN: 4.0000 mg | Freq: Four times a day (QID) | INTRAMUSCULAR | Status: DC | PRN

## 2013-06-28 NOTE — Progress Notes (Signed)
Utilization Review completed.  Khala Tarte RN CM  

## 2013-06-28 NOTE — ED Notes (Signed)
Patient with breast cancer on left side which has spread to her lungs has been feeling increasingly weak for about a week.  Decreased appetite also.

## 2013-06-28 NOTE — Progress Notes (Signed)
Attempted to get report from ED. ED RN is unable to give report at this time will try back. Danielle Bullock A

## 2013-06-28 NOTE — ED Notes (Signed)
MD/RN informed of abnormal lab 

## 2013-06-28 NOTE — Progress Notes (Signed)
   CARE MANAGEMENT ED NOTE 06/28/2013  Patient:  Danielle Bullock, Danielle Bullock   Account Number:  [id number]  Date Initiated:  06/28/2013  Documentation initiated by:  Radford Pax  Subjective/Objective Assessment:   Patient presents to Ed with increased weakness and decreased appetite.     Subjective/Objective Assessment Detail:   Patient with histoey of breast and lung cancer.     Action/Plan:   Action/Plan Detail:   Anticipated DC Date:       Status Recommendation to Physician:   Result of Recommendation:    Other ED Services  Consult Working Plan    DC [employer]  Other    Choice offered to / List presented to:            Status of service:  Completed, signed off  ED Comments:   ED Comments Detail:  EDCM spoke to patient's daughter [name] and patient's grand daughter Samuel Bouche at beside.  Patient's daughter [name] is th epatient's caretaker and medical POA.  Patient lives with her daughter [name].  Patient is currently active with [facility] for a visiting RN.  Patient's daughter reports that they would also like to have PT at home as well. She reports she has requested PT with [facility] before the holidays and doesn't know if the order was put in. Patient's daughter reports she is always with the patient. Patient's daughter reqesting home hospice.  [facility] will place request under physician sticky notes.  Patient's daughter confirms that Dr. Midge Minium- Bonsu is the patient's pcp. Patient's daughter's cell phone number [phone] or patient's gran daughter Samuel Bouche phone number [phone]. Patient's daughter reports patient is having difficulty affording medications.  EDCM instructed patient's daughter to have patient apply for Medicare part D for medication. Patient's daughter cannot remember the medications that are giving the patient the most trouble.  EDCM instructed patient's daughter to call the drug company itself as they may have a medication assistance program.  [facility] laso provided  patient's daughter with websites needymeds.org and GoodRX.com for medication assistance.  EDCM provided patient's daughter with 211 card.  Also provided patient's daughter with list of fianancial assitance in the community such as [employer].  Patient's daughter thankful for resources.  No further CM needs at thid time.

## 2013-06-28 NOTE — H&P (Signed)
[facility] History and Physical  Danielle Bullock XBJ:[id number] DOB: 1922-03-27 DOA: 06/28/2013  Referring physician: Nelva Nay, ER physician PCP: Jeanann Lewandowsky, MD  Consultants: Palliative medicine  Chief Complaint: Increasing weakness  HPI: Danielle Bullock is a 79 y.o. female   with past medical history of chronic kidney disease, chronic focal cord dysfunction, breast cancer and reportedly recurrent metastatic disease where patient has been told that she's not a candidate for chemotherapy who came into the emergency room after feeling increasingly weak for the past 7-10 days. Apparently, daughter thinks that patient focal cord dysfunction as gotten worse as for the last week she's not been able to take her pills or even take much by mouth without choking.  In the emergency room, patient was found to have a creatinine of 3 (baseline at 1.3) and more concerning, A potassium of 2.2. Patient was started on some IV fluids and admitted to the hospitalist service.   Review of Systems:  Patient seen after arrival to floor. She is doing okay. Feels a little bit better. Denies any headaches, vision changes. She says that she has problems swallowing. Denies any chest pain or palpitations. Her breathing is labored, but that is her baseline. She denies any shortness of breath or wheezing. Says that she coughs at times when she tries to eat or drink. Denies abdominal pain, hematuria, dysuria, constipation, diarrhea, focal extremity numbness or weakness or pain. Review systems otherwise negative.   Past Medical History  Diagnosis Date  . Cancer     breast/lung/liver  . Cancer, metastatic   . COPD (chronic obstructive pulmonary disease)   . CHF (congestive heart failure)   . Complication of anesthesia     hard to wake up  . Hypertension   . Coronary artery disease    Past Surgical History  Procedure Laterality Date  . Thyroid surgery    . Cardiac surgery    . Abdominal surgery    .  Breast surgery    . Small intestine surgery     Social History:  reports that she quit smoking about 44 years ago. Her smoking use included Cigarettes. She has a 45 pack-year smoking history. She has never used smokeless tobacco. She reports that she does not drink alcohol or use illicit drugs. Patient lives at home with her daughter who cares for her. Normally, she is able to ambulate with a walker   No Known Allergies  Family History  Problem Relation Age of Onset  . Hypertension Father   . Heart disease Father   . Hypertension Sister   . Hypertension Brother      Prior to Admission medications   Medication Sig Start Date End Date Taking? Authorizing Provider  albuterol (PROAIR HFA) 108 (90 BASE) MCG/ACT inhaler Inhale 2 puffs into the lungs every 4 (four) hours as needed for wheezing or shortness of breath. 04/19/13  Yes Alison Murray, MD  albuterol (PROVENTIL) (5 MG/ML) 0.5% nebulizer solution Take 0.5 mLs (2.5 mg total) by nebulization every 6 (six) hours as needed for wheezing or shortness of breath. DX 496 06/15/13  Yes Barbaraann Share, MD  aspirin 325 MG tablet Take 325 mg by mouth daily.   Yes Historical Provider, MD  budesonide (PULMICORT) 0.5 MG/2ML nebulizer solution Take 2 mLs (0.5 mg total) by nebulization 2 (two) times daily. 06/15/13  Yes Barbaraann Share, MD  Calcium Carbonate (CALCIUM 600 PO) Take 600 mg by mouth 2 (two) times daily.    Yes Historical Provider, MD  Cholecalciferol (VITAMIN D3) 2000 UNITS TABS Take 2,000 Int'l Units by mouth daily.   Yes Historical Provider, MD  clopidogrel (PLAVIX) 75 MG tablet Take 1 tablet (75 mg total) by mouth daily. 04/19/13  Yes Alison Murray, MD  Eszopiclone (ESZOPICLONE) 3 MG TABS Take 1 tablet (3 mg total) by mouth at bedtime. Take immediately before bedtime when necessary for insomnia 04/30/13  Yes Jeanann Lewandowsky, MD  ezetimibe (ZETIA) 10 MG tablet Take 1 tablet (10 mg total) by mouth daily. 04/19/13  Yes Alison Murray, MD   furosemide (LASIX) 40 MG tablet Take 1 tablet (40 mg total) by mouth daily. 04/19/13  Yes Alison Murray, MD  HYDROcodone-acetaminophen (NORCO/VICODIN) 5-325 MG per tablet Take 1 tablet by mouth every 8 (eight) hours as needed for moderate pain. 06/02/13  Yes Simmie Davies, NP  ipratropium (ATROVENT) 0.02 % nebulizer solution Take 2.5 mLs (0.5 mg total) by nebulization every 6 (six) hours as needed. DX 496 06/15/13  Yes Barbaraann Share, MD  letrozole [facility]) 2.5 MG tablet Take 1 tablet (2.5 mg total) by mouth daily. 03/10/13  Yes Victorino December, MD  lovastatin (MEVACOR) 40 MG tablet Take 80 mg by mouth at bedtime.   Yes Historical Provider, MD  omeprazole (PRILOSEC) 20 MG capsule Take 20 mg by mouth 2 (two) times daily before a meal.   Yes Historical Provider, MD  potassium chloride SA (K-DUR,KLOR-CON) 20 MEQ tablet Take 20 mEq by mouth daily.   Yes Historical Provider, MD  propranolol (INDERAL) 60 MG tablet Take 1 tablet (60 mg total) by mouth daily. 04/19/13  Yes Alison Murray, MD  Sennosides (SENNA LAX PO) Take 3 tablets by mouth daily as needed (constipation).    Yes Historical Provider, MD  sodium bicarbonate 650 MG tablet Take 650 mg by mouth 2 (two) times daily.   Yes Historical Provider, MD   Physical Exam: Filed Vitals:   06/28/13 1835  BP: 143/62  Pulse: 89  Temp: 98.2 F (36.8 C)  Resp: 22    BP 143/62  Pulse 89  Temp(Src) 98.2 F (36.8 C) (Oral)  Resp 22  Ht 5\' 3"  (1.6 m)  Wt 59.4 kg (130 lb 15.3 oz)  BMI 23.20 kg/m2  SpO2 100%  General:  Appears calm and comfortable Eyes:  sclera nonicteric, extraocular movements are intact  ENT:  normocephalic, atraumatic, mucous membranes are dry  Neck:  Supple, no JVD Cardiovascular: RRR, no m/r/g. No LE edema.  Respiratory: CTA bilaterally,  labored respiratory effort with extended expiratory phase  Abdomen:  soft, nontender, nondistended, positive bowel sounds  Skin: no rash or induration seen on limited  exam Musculoskeletal:  no clubbing cyanosis or edema Psychiatric:  patient is appropriate, no evidence of psychoses  Neurologic: grossly non-focal.          Labs on Admission:  Basic Metabolic Panel:  Recent Labs Lab 06/28/13 1520  NA 137  K 2.2*  CL 90*  CO2 30  GLUCOSE 99  BUN 48*  CREATININE 3.06*  CALCIUM 9.7  MG 2.2   Liver Function Tests:  Recent Labs Lab 06/28/13 1520  AST 34  ALT 25  ALKPHOS 83  BILITOT 0.8  PROT 7.9  ALBUMIN 4.0   No results found for this basename: LIPASE, AMYLASE,  in the last 168 hours No results found for this basename: AMMONIA,  in the last 168 hours CBC:  Recent Labs Lab 06/28/13 1520  WBC 6.2  HGB 15.2*  HCT 44.3  MCV 89.9  PLT 289   Cardiac Enzymes: No results found for this basename: CKTOTAL, CKMB, CKMBINDEX, TROPONINI,  in the last 168 hours  BNP (last 3 results)  Recent Labs  04/15/13 1226  PROBNP 78.1   CBG:  Recent Labs Lab 06/28/13 1524  GLUCAP 91    Radiological Exams on Admission: Dg Chest Portable 1 View  06/28/2013   CLINICAL DATA:  Generalized weakness  EXAM: PORTABLE CHEST - 1 VIEW  COMPARISON:  06/02/2013  FINDINGS: There is elevation of the left hemidiaphragm. Patient is status post median sternotomy coronary artery bypass grafting. Atherosclerotic calcifications are appreciated within the aorta. The cardiac silhouette standing upper limits of normal. The lungs are clear. The osseous structures unremarkable.  IMPRESSION: No evidence of acute cardiopulmonary disease.   Electronically Signed   By: Salome Holmes M.D.   On: 06/28/2013 15:15    EKG: Independently reviewed. EKG was read by ER physician. Is not sure not currently in the electronic medical record, but described as prolonged PR interval with nonspecific T wave changes, also seen on old EKG   Assessment/Plan Principal Problem:   Hypokalemia: Replacing. Active Problems:   CAD (coronary artery disease): Appears to be stable    Metastatic cancer: Given reported history of metastatic breast cancer, need to consider bigger picture. She was diagnosed with this in August and likely has lived in a few months to live   COPD (chronic obstructive pulmonary disease): Oxygen post breathing treatments   Essential hypertension, benign: Stable   Chronic kidney disease (CKD), stage IV (severe): Acute on chronic renal failure. IV fluids.    Vocal cord paralysis: See below.   Dysphagia, unspecified(787.20): Do not see that this is going to improve given her vocal cord dysfunction. I do not think it will be appropriate to consider PEG tube in this patient. Have consulted palliative medicine for goals of care.    ARF (acute renal failure): Secondary to dehydration, IV fluids    Code Status: Full code as indicated by patient's daughter states that the patient said that she wants   Family Communication: Patient started the bedside.  Disposition Plan: Likely here for several days as we determine long-term plans   Time spent: 40 minutes   Hollice Espy [facility] Pager [phone]*

## 2013-06-28 NOTE — ED Provider Notes (Signed)
CSN: [id number]     Arrival date & time 06/28/13  1420 History   First MD Initiated Contact with Patient 06/28/13 1510     Chief Complaint  Patient presents with  . Weakness   ) HPI Patient with breast cancer on left side which has spread to her lungs has been feeling increasingly weak for about a week. Decreased appetite also.  Past Medical History  Diagnosis Date  . Cancer     breast/lung/liver  . Cancer, metastatic   . COPD (chronic obstructive pulmonary disease)   . CHF (congestive heart failure)   . Complication of anesthesia     hard to wake up  . Hypertension   . Coronary artery disease    Past Surgical History  Procedure Laterality Date  . Thyroid surgery    . Cardiac surgery    . Abdominal surgery    . Breast surgery    . Small intestine surgery     Family History  Problem Relation Age of Onset  . Hypertension Father   . Heart disease Father   . Hypertension Sister   . Hypertension Brother    History  Substance Use Topics  . Smoking status: Former Smoker -- 1.00 packs/day for 45 years    Types: Cigarettes    Quit date: 04/15/1969  . Smokeless tobacco: Never Used  . Alcohol Use: No   OB History   Grav Para Term Preterm Abortions TAB SAB Ect Mult Living                 Review of Systems  Allergies  Review of patient's allergies indicates no known allergies.  Home Medications   Current Outpatient Rx  Name  Route  Sig  Dispense  Refill  . albuterol (PROAIR HFA) 108 (90 BASE) MCG/ACT inhaler   Inhalation   Inhale 2 puffs into the lungs every 4 (four) hours as needed for wheezing or shortness of breath.   1 Inhaler   3   . albuterol (PROVENTIL) (5 MG/ML) 0.5% nebulizer solution   Nebulization   Take 0.5 mLs (2.5 mg total) by nebulization every 6 (six) hours as needed for wheezing or shortness of breath. DX 496   360 mL   3   . aspirin 325 MG tablet   Oral   Take 325 mg by mouth daily.         . budesonide (PULMICORT) 0.5 MG/2ML  nebulizer solution   Nebulization   Take 2 mLs (0.5 mg total) by nebulization 2 (two) times daily.   120 mL   3     Dx 496   . Calcium Carbonate (CALCIUM 600 PO)   Oral   Take 600 mg by mouth 2 (two) times daily.          . Cholecalciferol (VITAMIN D3) 2000 UNITS TABS   Oral   Take 2,000 Int'l Units by mouth daily.         . clopidogrel (PLAVIX) 75 MG tablet   Oral   Take 1 tablet (75 mg total) by mouth daily.   30 tablet   3   . Eszopiclone (ESZOPICLONE) 3 MG TABS   Oral   Take 1 tablet (3 mg total) by mouth at bedtime. Take immediately before bedtime when necessary for insomnia   90 tablet   3   . ezetimibe (ZETIA) 10 MG tablet   Oral   Take 1 tablet (10 mg total) by mouth daily.   30 tablet   3   .  furosemide (LASIX) 40 MG tablet   Oral   Take 1 tablet (40 mg total) by mouth daily.   30 tablet   3   . HYDROcodone-acetaminophen (NORCO/VICODIN) 5-325 MG per tablet   Oral   Take 1 tablet by mouth every 8 (eight) hours as needed for moderate pain.   10 tablet   0   . ipratropium (ATROVENT) 0.02 % nebulizer solution   Nebulization   Take 2.5 mLs (0.5 mg total) by nebulization every 6 (six) hours as needed. DX 496   300 mL   3   . letrozole (FEMARA) 2.5 MG tablet   Oral   Take 1 tablet (2.5 mg total) by mouth daily.   90 tablet   6   . lovastatin (MEVACOR) 40 MG tablet   Oral   Take 80 mg by mouth at bedtime.         Marland Kitchen omeprazole (PRILOSEC) 20 MG capsule   Oral   Take 20 mg by mouth 2 (two) times daily before a meal.         . potassium chloride SA (K-DUR,KLOR-CON) 20 MEQ tablet   Oral   Take 20 mEq by mouth daily.         . propranolol (INDERAL) 60 MG tablet   Oral   Take 1 tablet (60 mg total) by mouth daily.   30 tablet   3   . Sennosides (SENNA LAX PO)   Oral   Take 3 tablets by mouth daily as needed (constipation).          . sodium bicarbonate 650 MG tablet   Oral   Take 650 mg by mouth 2 (two) times daily.           BP 116/59  Pulse 81  Temp(Src) 98.2 F (36.8 C) (Oral)  Resp 16  SpO2 98% Physical Exam  Nursing note and vitals reviewed. Constitutional: She is oriented to person, place, and time. She appears well-developed and well-nourished. No distress.  HENT:  Head: Normocephalic and atraumatic.  Eyes: Pupils are equal, round, and reactive to light.  Neck: Normal range of motion.  Cardiovascular: Normal rate and intact distal pulses.   Pulmonary/Chest: No respiratory distress. She has wheezes.  Abdominal: Normal appearance. She exhibits no distension.  Musculoskeletal: Normal range of motion.  Neurological: She is alert and oriented to person, place, and time. She displays tremor (Right hand). No cranial nerve deficit. GCS eye subscore is 4. GCS verbal subscore is 5. GCS motor subscore is 6.  Skin: Skin is warm and dry. No rash noted.  Psychiatric: She has a normal mood and affect. Her behavior is normal.    ED Course  Procedures (including critical care time)  Medications  potassium chloride 10 mEq in 100 mL IVPB (10 mEq Intravenous New Bag/Given 06/28/13 1638)    Labs Review Labs Reviewed  CBC - Abnormal; Notable for the following:    Hemoglobin 15.2 (*)    All other components within normal limits  BASIC METABOLIC PANEL - Abnormal; Notable for the following:    Potassium 2.2 (*)    Chloride 90 (*)    BUN 48 (*)    Creatinine, Ser 3.06 (*)    GFR calc non Af Amer 13 (*)    GFR calc Af Amer 15 (*)    All other components within normal limits  HEPATIC FUNCTION PANEL  GLUCOSE, CAPILLARY  URINALYSIS, ROUTINE W REFLEX MICROSCOPIC  MAGNESIUM   Imaging Review Dg Chest Portable 1  View  06/28/2013   CLINICAL DATA:  Generalized weakness  EXAM: PORTABLE CHEST - 1 VIEW  COMPARISON:  06/02/2013  FINDINGS: There is elevation of the left hemidiaphragm. Patient is status post median sternotomy coronary artery bypass grafting. Atherosclerotic calcifications are appreciated within the  aorta. The cardiac silhouette standing upper limits of normal. The lungs are clear. The osseous structures unremarkable.  IMPRESSION: No evidence of acute cardiopulmonary disease.   Electronically Signed   By: Salome Holmes M.D.   On: 06/28/2013 15:15      Date: 06/28/2013  Rate: 93  Rhythm: normal sinus rhythm  QRS Axis: normal  Intervals: PR prolonged  ST/T Wave abnormalities: nonspecific T wave changes  Conduction Disutrbances:none:   Old EKG Reviewed: changes noted      MDM   1. Hypokalemia   2. Weakness   3. Metastatic cancer        Nelia Shi, MD 06/28/13 1650

## 2013-06-29 LAB — CBC
HCT: 41.5 % (ref 36.0–46.0)
Hemoglobin: 14 g/dL (ref 12.0–15.0)
MCH: 30.5 pg (ref 26.0–34.0)
MCHC: 33.7 g/dL (ref 30.0–36.0)
MCV: 90.4 fL (ref 78.0–100.0)
Platelets: 276 K/uL (ref 150–400)
RBC: 4.59 MIL/uL (ref 3.87–5.11)
RDW: 13.7 % (ref 11.5–15.5)
WBC: 6.4 K/uL (ref 4.0–10.5)

## 2013-06-29 LAB — BASIC METABOLIC PANEL WITH GFR
BUN: 40 mg/dL — ABNORMAL HIGH (ref 6–23)
CO2: 23 meq/L (ref 19–32)
Calcium: 8.8 mg/dL (ref 8.4–10.5)
Chloride: 101 meq/L (ref 96–112)
Creatinine, Ser: 2.07 mg/dL — ABNORMAL HIGH (ref 0.50–1.10)
GFR calc Af Amer: 23 mL/min — ABNORMAL LOW
GFR calc non Af Amer: 20 mL/min — ABNORMAL LOW
Glucose, Bld: 77 mg/dL (ref 70–99)
Potassium: 3.4 meq/L — ABNORMAL LOW (ref 3.7–5.3)
Sodium: 142 meq/L (ref 137–147)

## 2013-06-29 LAB — URINE CULTURE
Colony Count: NO GROWTH
Culture: NO GROWTH

## 2013-06-29 MED ORDER — DEXTROSE-NACL 5-0.9 % IV SOLN
INTRAVENOUS | Status: DC
  Administered 2013-06-29 – 2013-06-30 (×3): via INTRAVENOUS

## 2013-06-29 MED ORDER — POTASSIUM CHLORIDE 10 MEQ/100ML IV SOLN
10.0000 meq | INTRAVENOUS | Status: AC
  Administered 2013-06-29 (×6): 10 meq via INTRAVENOUS
  Filled 2013-06-29 (×6): qty 100

## 2013-06-29 MED ORDER — VITAMINS A & D EX OINT
TOPICAL_OINTMENT | CUTANEOUS | Status: AC
  Administered 2013-06-29: 18:00:00
  Filled 2013-06-29: qty 5

## 2013-06-29 MED ORDER — ALUM & MAG HYDROXIDE-SIMETH 200-200-20 MG/5ML PO SUSP: 15.0000 mL | Freq: Four times a day (QID) | ORAL | Status: DC | PRN

## 2013-06-29 NOTE — Progress Notes (Signed)
[facility] PROGRESS NOTE  Shterna Laramee ZOX:[id number] DOB: Jun 01, 1922 DOA: 06/28/2013 PCP: Jeanann Lewandowsky, MD  Assessment/Plan:   Hypokalemia: After several IV infusions plus IV fluids, potassium better today. Magnesium still low. Active Problems:   CAD (coronary artery disease)   Metastatic cancer: For goals of care meeting tomorrow. Reportedly, patient only has a few more months to live, but given vocal cord paralysis, unable to take by mouth. Inquiring about tracheostomy and/or feeding tube.   COPD (chronic obstructive pulmonary disease): Stable    Essential hypertension, benign: Hypotensive    Chronic kidney disease (CKD), stage IV (severe): Slowly improving   Vocal cord paralysis   Dysphagia, unspecified(787.20)   ARF (acute renal failure): Creatinine down to 2, baseline is around 1.7. Continue IV fluids adjust the rate  Code Status: Full code Family Communication: Spoke with patient's daughter at the bedside Disposition Plan: As determined by goals of care meeting   Consultants:  Palliative care  Procedures:  None  Antibiotics:  None  HPI/Subjective: Patient doing okay. Feeling better. Hungry.  Objective: Filed Vitals:   06/29/13 1434  BP: 128/60  Pulse: 84  Temp: 98.4 F (36.9 C)  Resp: 20    Intake/Output Summary (Last 24 hours) at 06/29/13 1612 Last data filed at 06/29/13 1435  Gross per 24 hour  Intake 1398.33 ml  Output    900 ml  Net 498.33 ml   Filed Weights   06/28/13 1835  Weight: 59.4 kg (130 lb 15.3 oz)    Exam:   General:  Alert and oriented x2, no acute distress  Cardiovascular: Regular rate and rhythm, S1-S2  Respiratory: Clear to auscultation bilaterally  Abdomen: Soft, nontender, nondistended, positive bowel sounds  Musculoskeletal: No clubbing or cyanosis or edema   Data Reviewed: Basic Metabolic Panel:  Recent Labs Lab 06/28/13 1520 06/28/13 2120 06/29/13 0506  NA 137  --  142  K 2.2* 2.2* 3.4*  CL  90*  --  101  CO2 30  --  23  GLUCOSE 99  --  77  BUN 48*  --  40*  CREATININE 3.06*  --  2.07*  CALCIUM 9.7  --  8.8  MG 2.2  --   --    Liver Function Tests:  Recent Labs Lab 06/28/13 1520  AST 34  ALT 25  ALKPHOS 83  BILITOT 0.8  PROT 7.9  ALBUMIN 4.0   No results found for this basename: LIPASE, AMYLASE,  in the last 168 hours No results found for this basename: AMMONIA,  in the last 168 hours CBC:  Recent Labs Lab 06/28/13 1520 06/29/13 0506  WBC 6.2 6.4  HGB 15.2* 14.0  HCT 44.3 41.5  MCV 89.9 90.4  PLT 289 276   Cardiac Enzymes: No results found for this basename: CKTOTAL, CKMB, CKMBINDEX, TROPONINI,  in the last 168 hours BNP (last 3 results)  Recent Labs  04/15/13 1226  PROBNP 78.1   CBG:  Recent Labs Lab 06/28/13 1524  GLUCAP 91    No results found for this or any previous visit (from the past 240 hour(s)).   Studies: Dg Chest Portable 1 View  06/28/2013   CLINICAL DATA:  Generalized weakness  EXAM: PORTABLE CHEST - 1 VIEW  COMPARISON:  06/02/2013  FINDINGS: There is elevation of the left hemidiaphragm. Patient is status post median sternotomy coronary artery bypass grafting. Atherosclerotic calcifications are appreciated within the aorta. The cardiac silhouette standing upper limits of normal. The lungs are clear. The osseous structures unremarkable.  IMPRESSION: No evidence of acute cardiopulmonary disease.   Electronically Signed   By: Salome Holmes M.D.   On: 06/28/2013 15:15    Scheduled Meds: . sodium chloride  3 mL Intravenous Q12H   Continuous Infusions: . dextrose 5 % and 0.9% NaCl 75 mL/hr at 06/29/13 1007    Principal Problem:   Hypokalemia Active Problems:   CAD (coronary artery disease)   Metastatic cancer   COPD (chronic obstructive pulmonary disease)   Essential hypertension, benign   Chronic kidney disease (CKD), stage IV (severe)   Vocal cord paralysis   Dysphagia, unspecified(787.20)   ARF (acute renal  failure)    Time spent: 20 minutes    Hollice Espy  [facility] Pager [phone]. If 7PM-7AM, please contact night-coverage at [email or web address], password [facility] 06/29/2013, 4:12 PM  LOS: 1 day

## 2013-06-29 NOTE — Progress Notes (Addendum)
Thank you for consulting the Palliative Medicine Team at [facility] to meet your patient's and family's needs.   The reason that you asked [place] to see your patient is  For GOC  We have scheduled your patient for a meeting: 12/31 at 10 am  The Surrogate decision make is: Samarah Hogle, daughter Contact information: [phone]  Other family members that need to be present:  Granddaughter may come   Your patient is able/unable to participate: +/-  Additional Narrative:  Tried to talk with Dr. Welton Flakes, she is on Vacation until 12/5   Angelisse Riso L. Ladona Ridgel, MD MBA The Palliative Medicine Team at [facility] Phone: [phone] Pager: [phone]

## 2013-06-29 NOTE — Care Management Note (Addendum)
    Page 1 of 2   07/02/2013     4:44:10 PM   CARE MANAGEMENT NOTE 07/02/2013  Patient:  Danielle Bullock, Danielle Bullock   Account Number:  [id number]  Date Initiated:  06/29/2013  Documentation initiated by:  Lanier Clam  Subjective/Objective Assessment:   79 Y/O F ADMITTED W/SYNCOPE,HYPOKALEMIA.HX:HTN, DM,MULTIPLE CO MORBITIES.     Action/Plan:   FROM HOME W/DTR.ACTIVE W/[facility]-HHRN.   Anticipated DC Date:  07/02/2013   Anticipated DC Plan:  HOME W HOSPICE CARE      DC Planning Services  CM consult      Choice offered to / List presented to:  C-1 Patient           HH agency  [facility] [place]   Status of service:  Completed, signed off Medicare Important Message given?   (If response is "NO", the following Medicare IM given date fields will be blank) Date Medicare IM given:   Date Additional Medicare IM given:    Discharge Disposition:  HOME W HOSPICE CARE  Per UR Regulation:  Reviewed for med. necessity/level of care/duration of stay  If discussed at Long Length of Stay Meetings, dates discussed:    Comments:  07/02/12 Adalynn Corne RN,BSN NCM [phone] TC [facility] DME REP JERMAINE-DELIVERY OF DME WILL BE BETWEEN 6-9P(CLOSER TO 6P),DTR HAS ALREADY BEEN CALLED,SHE IS AWARE TO CALL HOSPITAL TEL#[phone] ONCE DME DELIVERED FOR AMBULANCE TRANSP OF PATIENT TO HOME.NURSE UPDATED.  RECEIVED HOME HOSPICE CHOICE REFERRAL.SPOKE TO PATIENT WHO DEFERRED TO DTR MARCIA Pompey TEL#[phone],PATIENT WILL STAY W/DTR [id number] [address].CHOSE [facility].TC MARGIE [facility] REP FOR REFERRAL,& D/C TODAY,FAMILY AGREE TO WEEKEND HOME VISIT.PCP-DR. OSEI BONSU,[facility] [address],& [place],WILL NEED EQUIPMENT-02,HOSPITAL BED,OVERLAY GEL MATTRESS,OVERBED TABLE,3N1,NEB.ALREADY HAS NEB MACHINE,& RW.TC [facility] JERMAINE DME REP ABOUT DME NEEDED.PROVIDED HIM WITH CONTACT NAME & TEL# FOR DELIVERY OF DME. WILL NEED AMBULANCE TRANSPORTATION.FAXED FACE SHEET,H&P,PROGRESS NOTES,MAR TO  FAX#[phone] [facility],CONFIRMED THAT THEY RECEIVED INFO.MD UPDATED.  06/29/13 Lounette Sloan RN,BSN NCM [phone] CONFIRMED W/[facility]-ACTIVE W/HHRN.NOTED FAMILY INTERESTED IN [facility] CARE.AWAIT PALLIATIVE CONS RECOMMENDATIONS.

## 2013-06-29 NOTE — Progress Notes (Signed)
Patient ZO:XWRU Luhmann      DOB: 09-21-1921      EAV:[id number]   Left message for daughter to call us back to schedule an appointment for goals of care.  Verlean Allport L. Ladona Ridgel, MD MBA The Palliative Medicine Team at [facility] Phone: [phone] Pager: [phone]

## 2013-06-29 NOTE — Progress Notes (Signed)
[facility]  Patient Status: Active (receiving services up to time of hospitalization)  [facility] is providing the following services: RN  If patient discharges after hours, please call [phone].   Lanae Crumbly 06/29/2013, 10:41 AM

## 2013-06-30 DIAGNOSIS — Z515 Encounter for palliative care: Secondary | ICD-10-CM

## 2013-06-30 LAB — BASIC METABOLIC PANEL
BUN: 22 mg/dL (ref 6–23)
CO2: 25 mEq/L (ref 19–32)
Chloride: 106 mEq/L (ref 96–112)
GFR calc Af Amer: 37 mL/min — ABNORMAL LOW (ref >90)
Glucose, Bld: 111 mg/dL — ABNORMAL HIGH (ref 70–99)
Potassium: 3.2 mEq/L — ABNORMAL LOW (ref 3.7–5.3)

## 2013-06-30 MED ORDER — MORPHINE SULFATE (CONCENTRATE) 10 MG /0.5 ML PO SOLN
5.0000 mg | ORAL | Status: DC | PRN
  Administered 2013-07-01 (×3): 5 mg via ORAL
  Filled 2013-06-30 (×3): qty 0.5

## 2013-06-30 MED ORDER — POTASSIUM CHLORIDE 20 MEQ/15ML (10%) PO LIQD
40.0000 meq | Freq: Once | ORAL | Status: AC
  Administered 2013-06-30: 40 meq via ORAL
  Filled 2013-06-30: qty 30

## 2013-06-30 NOTE — Consult Note (Signed)
Patient ZO:[id number] Bullock      DOB: 06-15-1922      EAV:[id number]     Consult Note from the Palliative Medicine Team at [facility]    Consult Requested by:Dr. Rito Ehrlich     PCP: Jeanann Lewandowsky, MD Reason for Consultation: Clarification GOC (discuss trach/PEG). Phone Number:[phone]  Assessment of patients Current state: 79 yo female with recurrent breast cancer now with mets to liver and lungs. She also has one paralytic vocal cord and COPD to complicate. Danielle Bullock and her daughter Danielle Bullock [phone]) and granddaughter Danielle Bullock [phone]) are present participants in this conversation. We thoroughly discussed her natural disease trajectory of her cancer and the limited options for treatment. They understand that she has limited life expectancy. They also verbalize understanding that resuscitation will not fix her underlying health problems and would not return her to baseline quality of life now. They decided to opt away from aggressive treatment so DNR is ordered. We also discussed the limited benefit of a tracheostomy and she is not interested in a surgical procedure and she wants to eat. She does NOT want tracheostomy or feeding tube. Her daughter struggled to understand the concept of comfort feeds and [name] not receiving "adequate nutrition" but after long discussion she felt more comfortable and they all agreed to no feeding tubes and to pursue speech therapy eval for recommendations for comfort feeds. We also discussed artificial hydration and rehospitalization with no definite decision. They are interested and considering hospice support in the home. Her daughter and granddaughter plan to take her home. We will continue to follow and support as we help them transition to their goals of comfort.   Goals of Care: 1.  Code Status: DNR   2. Scope of Treatment: 1. Vital Signs: yes 2. Respiratory/Oxygen: yes 3. Nutritional Support/Tube Feeds: no 4. Antibiotics: yes 5. IVF:  yes 6. Review of Medications to be discontinued: to be determined. 7. Labs: yes 8. Telemetry: yes 9. Consults: spiritual care   4. Disposition: Back home with daughter hopefully with the support of hospice.   3. Symptom Management:   1. Pain: Roxanol prn.  2. Nausea/Vomiting: Ondansetron prn.  4. Psychosocial: Emotional support provided to patient and family during difficult discussion.  5. Spiritual: Consulted.      Patient Documents Completed or Given: Document Given Completed  Advanced Directives Pkt    MOST    DNR    Gone from My Sight    Hard Choices yes     Brief HPI: 78 yo female with metastatic disease.   ROS: Denies pain, nausea, anxiety.   PMH:  Past Medical History  Diagnosis Date  . Cancer     breast/lung/liver  . Cancer, metastatic   . COPD (chronic obstructive pulmonary disease)   . CHF (congestive heart failure)   . Complication of anesthesia     hard to wake up  . Hypertension   . Coronary artery disease      PSH: Past Surgical History  Procedure Laterality Date  . Thyroid surgery    . Cardiac surgery    . Abdominal surgery    . Breast surgery    . Small intestine surgery     I have reviewed the FH and SH and  If appropriate update it with new information. No Known Allergies Scheduled Meds: . sodium chloride  3 mL Intravenous Q12H   Continuous Infusions: . dextrose 5 % and 0.9% NaCl 75 mL/hr at 06/30/13 1120   PRN Meds:.albuterol, morphine injection,  ondansetron (ZOFRAN) IV, ondansetron    BP 138/68  Pulse 84  Temp(Src) 98.4 F (36.9 C) (Oral)  Resp 18  Ht 5\' 3"  (1.6 m)  Wt 59.4 kg (130 lb 15.3 oz)  BMI 23.20 kg/m2  SpO2 100%   PPS: 30%   Intake/Output Summary (Last 24 hours) at 06/30/13 1121 Last data filed at 06/30/13 4540  Gross per 24 hour  Intake 1566.25 ml  Output   1550 ml  Net  16.25 ml    Physical Exam:  General: NAD, appears younger than stated age HEENT:  [place]/AT, no JVD Chest:   Stridor,  labored breathing relived by albuterol treatment CVS: RRR Abdomen: Soft, NT Ext: MAE, warm to touch Neuro: Alert, follows commands  Labs: CBC    Component Value Date/Time   WBC 6.4 06/29/2013 0506   WBC 11.7* 04/30/2013 1411   RBC 4.59 06/29/2013 0506   RBC 4.79 04/30/2013 1411   HGB 14.0 06/29/2013 0506   HGB 14.4 04/30/2013 1411   HCT 41.5 06/29/2013 0506   HCT 44.4 04/30/2013 1411   PLT 276 06/29/2013 0506   PLT 207 04/30/2013 1411   MCV 90.4 06/29/2013 0506   MCV 92.7 04/30/2013 1411   MCH 30.5 06/29/2013 0506   MCH 30.0 04/30/2013 1411   MCHC 33.7 06/29/2013 0506   MCHC 32.3 04/30/2013 1411   RDW 13.7 06/29/2013 0506   RDW 15.1* 04/30/2013 1411   LYMPHSABS 2.9 04/30/2013 1411   LYMPHSABS 1.4 04/15/2013 2024   MONOABS 0.9 04/30/2013 1411   MONOABS 0.1 04/15/2013 2024   EOSABS 0.1 04/30/2013 1411   EOSABS 0.0 04/15/2013 2024   BASOSABS 0.1 04/30/2013 1411   BASOSABS 0.0 04/15/2013 2024    BMET    Component Value Date/Time   NA 144 06/30/2013 0502   NA 140 04/30/2013 1411   K 3.2* 06/30/2013 0502   K 3.5 04/30/2013 1411   CL 106 06/30/2013 0502   CO2 25 06/30/2013 0502   CO2 23 04/30/2013 1411   GLUCOSE 111* 06/30/2013 0502   GLUCOSE 74 04/30/2013 1411   BUN 22 06/30/2013 0502   BUN 16.3 04/30/2013 1411   CREATININE 1.40* 06/30/2013 0502   CREATININE 1.3* 04/30/2013 1411   CALCIUM 9.2 06/30/2013 0502   CALCIUM 9.1 04/30/2013 1411   GFRNONAA 32* 06/30/2013 0502   GFRAA 37* 06/30/2013 0502    CMP     Component Value Date/Time   NA 144 06/30/2013 0502   NA 140 04/30/2013 1411   K 3.2* 06/30/2013 0502   K 3.5 04/30/2013 1411   CL 106 06/30/2013 0502   CO2 25 06/30/2013 0502   CO2 23 04/30/2013 1411   GLUCOSE 111* 06/30/2013 0502   GLUCOSE 74 04/30/2013 1411   BUN 22 06/30/2013 0502   BUN 16.3 04/30/2013 1411   CREATININE 1.40* 06/30/2013 0502   CREATININE 1.3* 04/30/2013 1411   CALCIUM 9.2 06/30/2013 0502   CALCIUM 9.1 04/30/2013 1411   PROT  7.9 06/28/2013 1520   PROT 6.5 04/30/2013 1411   ALBUMIN 4.0 06/28/2013 1520   ALBUMIN 3.2* 04/30/2013 1411   AST 34 06/28/2013 1520   AST 25 04/30/2013 1411   ALT 25 06/28/2013 1520   ALT 42 04/30/2013 1411   ALKPHOS 83 06/28/2013 1520   ALKPHOS 92 04/30/2013 1411   BILITOT 0.8 06/28/2013 1520   BILITOT 0.77 04/30/2013 1411   GFRNONAA 32* 06/30/2013 0502   GFRAA 37* 06/30/2013 0502     Time In Time Out Total Time Spent  with Patient Total Overall Time  1000 1130     Greater than 50%  of this time was spent counseling and coordinating care related to the above assessment and plan.  Yong Channel, NP Palliative Medicine Team Team Phone # [phone]   Discussed with Dr. Rito Ehrlich.

## 2013-06-30 NOTE — Progress Notes (Signed)
Respiratory requesting Mucinex PO for pt. MD please advise.

## 2013-06-30 NOTE — Evaluation (Signed)
Clinical/Bedside Swallow Evaluation Patient Details  Name: Danielle Bullock MRN: [id number] Date of Birth: [birth year]  Today's Date: 06/30/2013 Time: 4098-1191 SLP Time Calculation (min): 75 min  Past Medical History:  Past Medical History  Diagnosis Date  . Cancer     breast/lung/liver  . Cancer, metastatic   . COPD (chronic obstructive pulmonary disease)   . CHF (congestive heart failure)   . Complication of anesthesia     hard to wake up  . Hypertension   . Coronary artery disease    Past Surgical History:  Past Surgical History  Procedure Laterality Date  . Thyroid surgery    . Cardiac surgery    . Abdominal surgery    . Breast surgery    . Small intestine surgery     HPI:  79 yo female adm to [facility] with metastatic breast cancer - mets to lungs and liver.  Pt diagnosed with cancer in Sept 2014, she has vocal cord paralysis resulting in dysphagia.  PSH+ for parathyroid surgery.   Pt reports issues with coughing/choking with intake with expectoration of viscous secretions.  Palliative meeting took place and pt does not desire trach or PEG.  Comfort feeds desired, SLP ordered to help establish best consistency for comfort.  Pt CXR negative for acute issue.  She desires po intake.  Interview of pt conducted, however pt prolonged talking producing strong cough and expectoration of copious viscous secretions causing dyspnea.     Assessment / Plan / Recommendation Clinical Impression  Pt with clinical symptoms of severe pharyngeal dysphagia likely due to vagal nerve involvement with vocal fold paralysis.  Pt did not have improved phonatory ability with head turn left.  Overt coughing with expectoration of viscous secretions noted when pt talking for extended amount of time during session.  Pt reports this coughing is frequent occurence.   Observed pt consuming very small bites/sips/tsps of water, nectar thick juice, applesauce and cracker.  Intermittent cough noted without significant  difference between liquid consistencies.  SLP had lengthy discussion with pt regarding modifying diet, amount of intake, delivery method (eg tsp amounts, not using straws, etc) if decreases symptoms and increases comfort. Also discussed with pt re: asking pharmacy for liquid medication or crushing medication if not contraindicated and taking with applesauce/puree.  Pt is very aware of her dysphagia and using teach back confirmed possible modifications to ease swallow.  Comfort feeding handout provided to pt and reviewed briefly with pt.  SLP advised pt to share information with family to make sure they are aware when pt's swallow changes to accomodate her.    Recommend regular/thin to allow pt to select items she tolerates better.        Aspiration Risk  Severe    Diet Recommendation Regular;Thin liquid  To allow pt to choose her own meals  Liquid Administration via: Spoon;Cup Medication Administration:  (consider liquid or crushed if large) Supervision: Patient able to self feed Compensations: Slow rate;Small sips/bites Postural Changes and/or Swallow Maneuvers: Seated upright 90 degrees;Upright 30-60 min after meal    Other  Recommendations Oral Care Recommendations: Oral care BID Other Recommendations: Have oral suction available   Follow Up Recommendations       Frequency and Duration   n/a     Pertinent Vitals/Pain Afebrile, decreased    SLP Swallow Goals   n/a  Swallow Study Prior Functional Status   pt resides with daughter     General Date of Onset: 06/30/13 HPI: 79 yo female adm to [facility]  with metastatic breast cancer - mets to lungs and liver.  Pt diagnosed with cancer in Sept 2014, she has vocal cord paralysis resulting in dysphagia.  PSH+ for parathyroid surgery.   Pt reports issues with coughing/choking with intake with expectoration of viscous secretions.  Palliative meeting took place and pt does not desire trach or PEG.  Comfort feeds desired, SLP ordered to help  establish best consistency for comfort.  Pt CXR negative for acute issue.  She desires po intake.  Interview of pt conducted, however pt prolonged talking producing strong cough and expectoration of copious viscous secretions causing dyspnea.   Type of Study: Bedside swallow evaluation Diet Prior to this Study: NPO Temperature Spikes Noted: No Respiratory Status: Room air History of Recent Intubation: No Behavior/Cognition: Alert;Cooperative;Pleasant mood Oral Cavity - Dentition: Dentures, top;Adequate natural dentition Self-Feeding Abilities: Able to feed self (has slight tremor) Patient Positioning: Upright in bed Baseline Vocal Quality: Low vocal intensity;Breathy;Hoarse (pt with known left vocal fold paralysis) Volitional Cough: Strong (productive to viscous secretions) Volitional Swallow: Able to elicit    Oral/Motor/Sensory Function Overall Oral Motor/Sensory Function: Appears within functional limits for tasks assessed (? mild right facial asymmetry, Vagal nerve involvement c/b hoarseness, left vocal fold paralysis)   Ice Chips Ice chips: Within functional limits Presentation: Self Fed;Spoon   Thin Liquid Thin Liquid: Impaired Presentation: Cup;Straw;Self Fed Pharyngeal  Phase Impairments: Suspected delayed Swallow;Cough - Immediate    Nectar Thick Nectar Thick Liquid: Impaired Presentation: Cup;Spoon;Self Fed Pharyngeal Phase Impairments: Cough - Delayed;Cough - Immediate;Multiple swallows Other Comments: expectoration of viscous secretions   Honey Thick Honey Thick Liquid: Not tested   Puree Presentation: Self Fed;Spoon Other Comments: pt taking very small bites, 1/4 tsp amount, reports adequate tolerance   Solid   GO    Solid: Impaired Presentation: Self Fed Pharyngeal Phase Impairments: Cough - Delayed       Mills Koller, MS [facility] SLP [phone]

## 2013-06-30 NOTE — Progress Notes (Signed)
[facility] PROGRESS NOTE  Danielle Bullock ZOX:[id number] DOB: 15-Oct-1921 DOA: 06/28/2013 PCP: Jeanann Lewandowsky, MD  Assessment/Plan:   Hypokalemia: After several IV infusions plus IV fluids, potassium better.  At 3.2 today and replacing. Recheck tomorrow Active Problems:   CAD (coronary artery disease)   Metastatic cancer: For goals of care meeting today. Plans for hospice care. For aggressive intervention such as PEG tube or tracheostomy. Greatly appreciate palliative care assistance.    COPD (chronic obstructive pulmonary disease): Stable    Essential hypertension, benign: Hypotensive    Chronic kidney disease (CKD), stage IV (severe): Slowly improving   Vocal cord paralysis: Appreciate speech therapy help. On regular diet for comfort feeds   Dysphagia, unspecified(787.20)   ARF (acute renal failure): With IV fluids, down to baseline at 1.4  Code Status: Full code Family Communication: Spoke with patient's daughter at the bedside yesterday Disposition Plan: Home with hospice, likely tomorrow or Friday   Consultants:  Palliative care  Procedures:  None  Antibiotics:  None  HPI/Subjective: Patient doing okay. Feeling better now that she is able to take some by mouth  Objective: Filed Vitals:   06/30/13 1443  BP: 143/66  Pulse: 92  Temp: 98.3 F (36.8 C)  Resp: 20    Intake/Output Summary (Last 24 hours) at 06/30/13 1849 Last data filed at 06/30/13 1523  Gross per 24 hour  Intake 1566.25 ml  Output   1550 ml  Net  16.25 ml   Filed Weights   06/28/13 1835  Weight: 59.4 kg (130 lb 15.3 oz)    Exam:   General:  Alert and oriented x2, no acute distress  Cardiovascular: Regular rate and rhythm, S1-S2  Respiratory: Clear to auscultation bilaterally  Abdomen: Soft, nontender, nondistended, positive bowel sounds  Musculoskeletal: No clubbing or cyanosis or edema   Data Reviewed: Basic Metabolic Panel:  Recent Labs Lab 06/28/13 1520  06/28/13 2120 06/29/13 0506 06/30/13 0502  NA 137  --  142 144  K 2.2* 2.2* 3.4* 3.2*  CL 90*  --  101 106  CO2 30  --  23 25  GLUCOSE 99  --  77 111*  BUN 48*  --  40* 22  CREATININE 3.06*  --  2.07* 1.40*  CALCIUM 9.7  --  8.8 9.2  MG 2.2  --   --   --    Liver Function Tests:  Recent Labs Lab 06/28/13 1520  AST 34  ALT 25  ALKPHOS 83  BILITOT 0.8  PROT 7.9  ALBUMIN 4.0   No results found for this basename: LIPASE, AMYLASE,  in the last 168 hours No results found for this basename: AMMONIA,  in the last 168 hours CBC:  Recent Labs Lab 06/28/13 1520 06/29/13 0506  WBC 6.2 6.4  HGB 15.2* 14.0  HCT 44.3 41.5  MCV 89.9 90.4  PLT 289 276   Cardiac Enzymes: No results found for this basename: CKTOTAL, CKMB, CKMBINDEX, TROPONINI,  in the last 168 hours BNP (last 3 results)  Recent Labs  04/15/13 1226  PROBNP 78.1   CBG:  Recent Labs Lab 06/28/13 1524  GLUCAP 91    Recent Results (from the past 240 hour(s))  URINE CULTURE     Status: None   Collection Time    06/28/13  9:38 PM      Result Value Range Status   Specimen Description URINE, CLEAN CATCH   Final   Special Requests NONE   Final   Culture  Setup Time  Final   Value: 06/29/2013 01:11     Performed at [facility] Count     Final   Value: NO GROWTH     Performed at [facility]   Culture     Final   Value: NO GROWTH     Performed at [facility]   Report Status 06/29/2013 FINAL   Final     Studies: No results found.  Scheduled Meds: . sodium chloride  3 mL Intravenous Q12H   Continuous Infusions: . dextrose 5 % and 0.9% NaCl 75 mL/hr at 06/30/13 1120    Principal Problem:   Hypokalemia Active Problems:   CAD (coronary artery disease)   Metastatic cancer   COPD (chronic obstructive pulmonary disease)   Essential hypertension, benign   Chronic kidney disease (CKD), stage IV (severe)   Vocal cord paralysis   Dysphagia,  unspecified(787.20)   ARF (acute renal failure)    Time spent: 15 minutes    Hollice Espy  [facility] Pager [phone]. If 7PM-7AM, please contact night-coverage at [email or web address], password [facility] 06/30/2013, 6:49 PM  LOS: 2 days

## 2013-07-01 DIAGNOSIS — J3800 Paralysis of vocal cords and larynx, unspecified: Secondary | ICD-10-CM

## 2013-07-01 DIAGNOSIS — J449 Chronic obstructive pulmonary disease, unspecified: Secondary | ICD-10-CM

## 2013-07-01 LAB — BASIC METABOLIC PANEL
BUN: 11 mg/dL (ref 6–23)
CO2: 23 mEq/L (ref 19–32)
Calcium: 9.7 mg/dL (ref 8.4–10.5)
Chloride: 109 mEq/L (ref 96–112)
Creatinine, Ser: 1.21 mg/dL — ABNORMAL HIGH (ref 0.50–1.10)
GFR calc Af Amer: 45 mL/min — ABNORMAL LOW (ref >90)
GFR calc non Af Amer: 38 mL/min — ABNORMAL LOW (ref >90)
Glucose, Bld: 88 mg/dL (ref 70–99)
Potassium: 3.5 mEq/L — ABNORMAL LOW (ref 3.7–5.3)
Sodium: 148 mEq/L — ABNORMAL HIGH (ref 137–147)

## 2013-07-01 MED ORDER — IPRATROPIUM-ALBUTEROL 0.5-2.5 (3) MG/3ML IN SOLN
3.0000 mL | Freq: Three times a day (TID) | RESPIRATORY_TRACT | Status: DC
  Administered 2013-07-01 – 2013-07-02 (×5): 3 mL via RESPIRATORY_TRACT
  Filled 2013-07-01 (×5): qty 3

## 2013-07-01 NOTE — Progress Notes (Signed)
Progress Note from the Palliative Medicine Team at [facility]: Danielle Bullock looks well today and is sitting up in bed and reading her book when I came in the room (she was a Licensed conveyancer and loves to read). I discussed with Danielle Bullock, her daughter Danielle Bullock and granddaughter Danielle Bullock about plans to go home and further elicit goals. We completed the MOST form and confirmed DNR. They have opted for limited use of antibiotics and understand that at some point down the road antibiotics would not be indicated if she is trying to pass on. IV fluids on a trial basis and again with the understanding that IV fluids can do more harm at end of life, they are understanding. They have decided on feeding tube for a defined trial period but are leaning more towards no feeding tube. We discussed at length the limited use of feeding tubes and artificial nutrition would be for Danielle Bullock. I reinforced our discussion yesterday on concept of comfort feeds and what that looks like. We further discussed the natural trajectory of her disease and what end of life will look like. I also referred them to Hard Choices book. They plan to go home with hospice support and case management is consulted to offer choice. I am hopeful that hospice may continue these conversations in the home. They will need a hospital bed and equipment in the home from hospice for discharge. They also hope to limit pill usage upon discharge and change medications she really needs to be crushed or an elixir. Will continue to support holistically. Armandina Gemma DNR placed on chart for discharge.    Objective: No Known Allergies Scheduled Meds: . ipratropium-albuterol  3 mL Nebulization TID  . sodium chloride  3 mL Intravenous Q12H   Continuous Infusions:  PRN Meds:.albuterol, morphine CONCENTRATE, ondansetron (ZOFRAN) IV, ondansetron  BP 147/68  Pulse 90  Temp(Src) 97.9 F (36.6 C) (Oral)  Resp 20  Ht 5\' 3"  (1.6 m)  Wt 59.4 kg (130 lb 15.3 oz)   BMI 23.20 kg/m2  SpO2 99%   PPS: 40%     Intake/Output Summary (Last 24 hours) at 07/01/13 1630 Last data filed at 07/01/13 1442  Gross per 24 hour  Intake   1080 ml  Output    645 ml  Net    435 ml       Physical Exam:  General: NAD, good spirits HEENT: [place]/AT, no JVD Chest: Breathing symmetric and unlabored, improved from yesterday CVS: RRR, S1 S2 Abdomen: Soft, NT, ND Ext: MAE, warm to touch Neuro: Alert, oriented, able to converse   Labs: CBC    Component Value Date/Time   WBC 6.4 06/29/2013 0506   WBC 11.7* 04/30/2013 1411   RBC 4.59 06/29/2013 0506   RBC 4.79 04/30/2013 1411   HGB 14.0 06/29/2013 0506   HGB 14.4 04/30/2013 1411   HCT 41.5 06/29/2013 0506   HCT 44.4 04/30/2013 1411   PLT 276 06/29/2013 0506   PLT 207 04/30/2013 1411   MCV 90.4 06/29/2013 0506   MCV 92.7 04/30/2013 1411   MCH 30.5 06/29/2013 0506   MCH 30.0 04/30/2013 1411   MCHC 33.7 06/29/2013 0506   MCHC 32.3 04/30/2013 1411   RDW 13.7 06/29/2013 0506   RDW 15.1* 04/30/2013 1411   LYMPHSABS 2.9 04/30/2013 1411   LYMPHSABS 1.4 04/15/2013 2024   MONOABS 0.9 04/30/2013 1411   MONOABS 0.1 04/15/2013 2024   EOSABS 0.1 04/30/2013 1411   EOSABS 0.0 04/15/2013 2024   BASOSABS  0.1 04/30/2013 1411   BASOSABS 0.0 04/15/2013 2024    BMET    Component Value Date/Time   NA 148* 07/01/2013 0553   NA 140 04/30/2013 1411   K 3.5* 07/01/2013 0553   K 3.5 04/30/2013 1411   CL 109 07/01/2013 0553   CO2 23 07/01/2013 0553   CO2 23 04/30/2013 1411   GLUCOSE 88 07/01/2013 0553   GLUCOSE 74 04/30/2013 1411   BUN 11 07/01/2013 0553   BUN 16.3 04/30/2013 1411   CREATININE 1.21* 07/01/2013 0553   CREATININE 1.3* 04/30/2013 1411   CALCIUM 9.7 07/01/2013 0553   CALCIUM 9.1 04/30/2013 1411   GFRNONAA 38* 07/01/2013 0553   GFRAA 45* 07/01/2013 0553    CMP     Component Value Date/Time   NA 148* 07/01/2013 0553   NA 140 04/30/2013 1411   K 3.5* 07/01/2013 0553   K 3.5 04/30/2013 1411   CL 109 07/01/2013 0553   CO2  23 07/01/2013 0553   CO2 23 04/30/2013 1411   GLUCOSE 88 07/01/2013 0553   GLUCOSE 74 04/30/2013 1411   BUN 11 07/01/2013 0553   BUN 16.3 04/30/2013 1411   CREATININE 1.21* 07/01/2013 0553   CREATININE 1.3* 04/30/2013 1411   CALCIUM 9.7 07/01/2013 0553   CALCIUM 9.1 04/30/2013 1411   PROT 7.9 06/28/2013 1520   PROT 6.5 04/30/2013 1411   ALBUMIN 4.0 06/28/2013 1520   ALBUMIN 3.2* 04/30/2013 1411   AST 34 06/28/2013 1520   AST 25 04/30/2013 1411   ALT 25 06/28/2013 1520   ALT 42 04/30/2013 1411   ALKPHOS 83 06/28/2013 1520   ALKPHOS 92 04/30/2013 1411   BILITOT 0.8 06/28/2013 1520   BILITOT 0.77 04/30/2013 1411   GFRNONAA 38* 07/01/2013 0553   GFRAA 45* 07/01/2013 0553       Assessment and Plan: 1. Code Status: DNR 2. Symptom Control: 1. Pain: Roxanol prn. 2. Nausea: Ondansetron prn.  3. Psycho/Social: Emotional support and empowerment provided to patient and family.  4. Disposition: Home with hospice.   Patient Documents Completed or Given: Document Given Completed  Advanced Directives Pkt    MOST yes yes  DNR  yes  Gone from My Sight    Hard Choices      Time In Time Out Total Time Spent with Patient Total Overall Time  1600 1645 72min 64min    Greater than 50%  of this time was spent counseling and coordinating care related to the above assessment and plan.  Vinie Sill, NP Palliative Medicine Team Team Phone # [phone]    1

## 2013-07-01 NOTE — Progress Notes (Signed)
[facility] PROGRESS NOTE  Danielle Bullock GYF:[id number] DOB: 22-Sep-1921 DOA: 06/28/2013 PCP: [name], MD  Assessment/Plan:   Hypokalemia: After several IV infusions plus IV fluids, potassium better.  Stable today Active Problems:   CAD (coronary artery disease)   Metastatic cancer: For goals of care meeting today. Plans for home with hospice care. No aggressive intervention such as PEG tube or tracheostomy. Greatly appreciate palliative care assistance.    COPD (chronic obstructive pulmonary disease): Stable    Essential hypertension, benign: Blood pressure stable.    Chronic kidney disease (CKD), stage IV (severe): Slowly improving   Vocal cord paralysis: Appreciate speech therapy help. On regular diet for comfort feeds   Dysphagia, unspecified(787.20)   ARF (acute renal failure): With IV fluids, down to baseline at 1.4  Code Status: Full code Family Communication: Spoke with patient's daughter at the bedside yesterday Disposition Plan: Home with hospice, tomorrow  Consultants:  Palliative care  Procedures:  None  Antibiotics:  None  HPI/Subjective: Patient doing okay. Had a few choking episodes last night and is nervous about taking some medications  Objective: Filed Vitals:   07/01/13 1443  BP: 147/68  Pulse: 90  Temp: 97.9 F (36.6 C)  Resp: 20    Intake/Output Summary (Last 24 hours) at 07/01/13 1559 Last data filed at 07/01/13 1442  Gross per 24 hour  Intake   1080 ml  Output    645 ml  Net    435 ml   Filed Weights   06/28/13 1835  Weight: 59.4 kg (130 lb 15.3 oz)    Exam:   General:  Alert and oriented x2, no acute distress  Cardiovascular: Regular rate and rhythm, S1-S2  Respiratory: Clear to auscultation bilaterally, although labored breathing  Abdomen: Soft, nontender, nondistended, positive bowel sounds  Musculoskeletal: No clubbing or cyanosis or edema   Data Reviewed: Basic Metabolic Panel:  Recent Labs Lab  06/28/13 1520 06/28/13 2120 06/29/13 0506 06/30/13 0502 07/01/13 0553  NA 137  --  142 144 148*  K 2.2* 2.2* 3.4* 3.2* 3.5*  CL 90*  --  101 106 109  CO2 30  --  23 25 23   GLUCOSE 99  --  77 111* 88  BUN 48*  --  40* 22 11  CREATININE 3.06*  --  2.07* 1.40* 1.21*  CALCIUM 9.7  --  8.8 9.2 9.7  MG 2.2  --   --   --   --    Liver Function Tests:  Recent Labs Lab 06/28/13 1520  AST 34  ALT 25  ALKPHOS 83  BILITOT 0.8  PROT 7.9  ALBUMIN 4.0   No results found for this basename: LIPASE, AMYLASE,  in the last 168 hours No results found for this basename: AMMONIA,  in the last 168 hours CBC:  Recent Labs Lab 06/28/13 1520 06/29/13 0506  WBC 6.2 6.4  HGB 15.2* 14.0  HCT 44.3 41.5  MCV 89.9 90.4  PLT 289 276   Cardiac Enzymes: No results found for this basename: CKTOTAL, CKMB, CKMBINDEX, TROPONINI,  in the last 168 hours BNP (last 3 results)  Recent Labs  04/15/13 1226  PROBNP 78.1   CBG:  Recent Labs Lab 06/28/13 1524  GLUCAP 91    Recent Results (from the past 240 hour(s))  URINE CULTURE     Status: None   Collection Time    06/28/13  9:38 PM      Result Value Range Status   Specimen Description URINE, CLEAN CATCH  Final   Special Requests NONE   Final   Culture  Setup Time     Final   Value: 06/29/2013 01:11     Performed at [facility]     Final   Value: NO GROWTH     Performed at [facility]   Culture     Final   Value: NO GROWTH     Performed at [facility]   Report Status 06/29/2013 FINAL   Final     Studies: No results found.  Scheduled Meds: . ipratropium-albuterol  3 mL Nebulization TID  . sodium chloride  3 mL Intravenous Q12H   Continuous Infusions:    Principal Problem:   Hypokalemia Active Problems:   CAD (coronary artery disease)   Metastatic cancer   COPD (chronic obstructive pulmonary disease)   Essential hypertension, benign   Chronic kidney disease (CKD), stage IV  (severe)   Vocal cord paralysis   Dysphagia, unspecified(787.20)   ARF (acute renal failure)    Time spent: 15 minutes    [facility] Pager [phone]. If 7PM-7AM, please contact night-coverage at [email or web address], password [email or web address] 07/01/2013, 3:59 PM  LOS: 3 days

## 2013-07-01 NOTE — Progress Notes (Signed)
07/01/13 0955  Clinical Encounter Type  Visited With Patient  Visit Type Spiritual support;Social support  Referral From [facility] consult)  Spiritual Encounters  Spiritual Needs Emotional;Prayer (prayer shawl and handmade heart pillow)  Stress Factors  Patient Stress Factors (moved from [place] home in April 09, 2023; brother died in 06/09/23)   Ms Crusoe was in good spirits during this lengthy visit.  Per pt, she has lived in [place] and [place], and, though she misses her home in [place], understands that moving to her daughter's home in [place] upon her (pt's) cancer dx was a good choice for her and her family.  News of her children, grands, and great-grands gives her joy.  Per pt, her biggest focus in life is "drawing closer to the [place]," which she does through regular Bible/devotional reading and prayer.  She was very Patent attorney of pastoral presence and reflection, prayer, and prayer shawl.  Pt reports no other needs at this time.  [place], [place]

## 2013-07-02 DIAGNOSIS — N184 Chronic kidney disease, stage 4 (severe): Secondary | ICD-10-CM

## 2013-07-02 MED ORDER — MORPHINE SULFATE (CONCENTRATE) 10 MG /0.5 ML PO SOLN: 5.0000 mg | ORAL | Status: DC | PRN

## 2013-07-02 NOTE — Progress Notes (Signed)
MD on call was notified of B/P 168/83. Awaiting any new orders. Will continue to monitor the patient.

## 2013-07-02 NOTE — Discharge Summary (Signed)
Physician Discharge Summary  Danielle Bullock IRC:[id number] DOB: 11/27/1921 DOA: 06/28/2013  PCP: [name], MD  Admit date: 06/28/2013 Discharge date: 07/02/2013  Time spent: 25 minutes  Recommendations for Outpatient Follow-up:  1. Patient is being discharged with home with hospice 2. Most of her medications are being discontinued to simplify her medical regimen with a focus on quality and palliative care  Discharge Diagnoses:  Principal Problem:   Hypokalemia Active Problems:   CAD (coronary artery disease)   Metastatic cancer   COPD (chronic obstructive pulmonary disease)   Essential hypertension, benign   Chronic kidney disease (CKD), stage IV (severe)   Vocal cord paralysis   Dysphagia, unspecified(787.20)   ARF (acute renal failure)   Palliative care encounter   Discharge Condition: Improved from initial presentation, long-term prognosis is limited  Diet recommendation: Regular diet, comfort feeds as patient tolerates  Filed Weights   06/28/13 1835  Weight: 59.4 kg (130 lb 15.3 oz)    History of present illness:  On 12/29, 80 y.o. female with past medical history of chronic kidney disease, chronic focal cord dysfunction, breast cancer and reportedly recurrent metastatic disease where patient has been told that she's not a candidate for chemotherapy who came into the emergency room after feeling increasingly weak for the past 7-10 days. Apparently, daughter thinks that patient focal cord dysfunction as gotten worse as for the last week she's not been able to take her pills or even take much by mouth without choking.  In the emergency room, patient was found to have a creatinine of 3 (baseline at 1.3) and more concerning, A potassium of 2.2. Patient was started on IV fluids and admitted to the hospitalist service.   Hospital Course:  Principal Problem:   Hypokalemia: Patient received several IV infusions of potassium plus normal saline with potassium. By 1/1,  patient's potassium was relatively normalized. Magnesium level checked and found to be normal Active Problems:   CAD (coronary artery disease): Stable.    Metastatic cancer: Given overall prognosis is very limited, hospice and palliative care consulted for goals of care meeting. After family conference along with the patient, the decision was made to go home with hospice. There be a focus on quality of life as well as palliative options. Most medications will be discontinued. Patient's diet will be evaluated by speech therapy and then as tolerated. She received nebulizer treatments to help aid with her breathing.    COPD (chronic obstructive pulmonary disease): Stable.    Essential hypertension, benign: Stable during his hospitalization.    Chronic kidney disease (CKD), stage IV (severe): Improved with IV fluids. By 1/1, creatinine down to 1.21    Vocal cord paralysis: As above.   Dysphagia, unspecified(787.20): Secondary vocal cord paralysis. After goals of care meeting done, speech therapy evaluated patient recommending regular diet as patient tolerates    ARF (acute renal failure): Secondary dehydration. With IV fluids, creatinine back to baseline   Palliative care encounter  Procedures:  None  Consultations:  Palliative care  Discharge Exam: Filed Vitals:   07/02/13 1322  BP: 164/84  Pulse:   Temp: 98.3 F (36.8 C)  Resp: 20    General: Alert and oriented x3, no acute distress, labored breathing Cardiovascular: Regular rate and rhythm, S1-S2 Respiratory: Labored breathing, but lungs otherwise clear to auscultation  Discharge Instructions  Discharge Orders   Future Appointments Provider Department Dept Phone   07/29/2013 2:30 PM [facility] [place] [phone]   07/29/2013 3:00  PM Deatra Mcnear, [place] [phone]   07/30/2013 11:00 AM Lorayne Marek, MD [place] [phone]   11/10/2013 2:00 PM Kathee Delton, MD [facility] [phone]   Future Orders Complete By Expires   Diet general  As directed    Increase activity slowly  As directed        Medication List    STOP taking these medications       aspirin 325 MG tablet     budesonide 0.5 MG/2ML nebulizer solution  Commonly known as:  PULMICORT     CALCIUM 600 PO     clopidogrel 75 MG tablet  Commonly known as:  PLAVIX     ezetimibe 10 MG tablet  Commonly known as:  ZETIA     furosemide 40 MG tablet  Commonly known as:  LASIX     HYDROcodone-acetaminophen 5-325 MG per tablet  Commonly known as:  NORCO/VICODIN     ipratropium 0.02 % nebulizer solution  Commonly known as:  ATROVENT     letrozole 2.5 MG tablet  Commonly known as:  FEMARA     lovastatin 40 MG tablet  Commonly known as:  MEVACOR     potassium chloride SA 20 MEQ tablet  Commonly known as:  K-DUR,KLOR-CON     propranolol 60 MG tablet  Commonly known as:  INDERAL     SENNA LAX PO     sodium bicarbonate 650 MG tablet     Vitamin D3 2000 UNITS Tabs      TAKE these medications       albuterol (5 MG/ML) 0.5% nebulizer solution  Commonly known as:  PROVENTIL  Take 0.5 mLs (2.5 mg total) by nebulization every 6 (six) hours as needed for wheezing or shortness of breath. DX 496     Eszopiclone 3 MG Tabs  Commonly known as:  eszopiclone  Take 1 tablet (3 mg total) by mouth at bedtime. Take immediately before bedtime when necessary for insomnia     morphine CONCENTRATE 10 mg / 0.5 ml concentrated solution  Take 0.25 mLs (5 mg total) by mouth every 4 (four) hours as needed for moderate pain, severe pain or shortness of breath.     omeprazole 20 MG capsule  Commonly known as:  PRILOSEC  Take 20 mg by mouth 2 (two) times daily before a meal.       No Known Allergies    The results of significant diagnostics from this hospitalization (including imaging, microbiology, ancillary and  laboratory) are listed below for reference.    Significant Diagnostic Studies: Dg Chest Portable 1 View  06/28/2013   CLINICAL DATA:  Generalized weakness  EXAM: PORTABLE CHEST - 1 VIEW  COMPARISON:  06/02/2013  FINDINGS: There is elevation of the left hemidiaphragm. Patient is status post median sternotomy coronary artery bypass grafting. Atherosclerotic calcifications are appreciated within the aorta. The cardiac silhouette standing upper limits of normal. The lungs are clear. The osseous structures unremarkable.  IMPRESSION: No evidence of acute cardiopulmonary disease.   Electronically Signed   By: Margaree Mackintosh M.D.   On: 06/28/2013 15:15    Microbiology: Recent Results (from the past 240 hour(s))  URINE CULTURE     Status: None   Collection Time    06/28/13  9:38 PM      Result Value Range Status   Specimen Description URINE, CLEAN CATCH   Final   Special Requests NONE   Final  Culture  Setup Time     Final   Value: 06/29/2013 01:11     Performed at [place]     Final   Value: NO GROWTH     Performed at [facility]   Culture     Final   Value: NO GROWTH     Performed at [facility]   Report Status 06/29/2013 FINAL   Final     Labs: Basic Metabolic Panel:  Recent Labs Lab 06/28/13 1520 06/28/13 2120 06/29/13 0506 06/30/13 0502 07/01/13 0553  NA 137  --  142 144 148*  K 2.2* 2.2* 3.4* 3.2* 3.5*  CL 90*  --  101 106 109  CO2 30  --  23 25 23   GLUCOSE 99  --  77 111* 88  BUN 48*  --  40* 22 11  CREATININE 3.06*  --  2.07* 1.40* 1.21*  CALCIUM 9.7  --  8.8 9.2 9.7  MG 2.2  --   --   --   --    Liver Function Tests:  Recent Labs Lab 06/28/13 1520  AST 34  ALT 25  ALKPHOS 83  BILITOT 0.8  PROT 7.9  ALBUMIN 4.0   No results found for this basename: LIPASE, AMYLASE,  in the last 168 hours No results found for this basename: AMMONIA,  in the last 168 hours CBC:  Recent Labs Lab 06/28/13 1520 06/29/13 0506   WBC 6.2 6.4  HGB 15.2* 14.0  HCT 44.3 41.5  MCV 89.9 90.4  PLT 289 276   Cardiac Enzymes: No results found for this basename: CKTOTAL, CKMB, CKMBINDEX, TROPONINI,  in the last 168 hours BNP: BNP (last 3 results)  Recent Labs  04/15/13 1226  PROBNP 78.1   CBG:  Recent Labs Lab 06/28/13 1524  GLUCAP 91       Signed:  Neelam Tiggs K  [facility] 07/02/2013, 3:38 PM

## 2013-07-02 NOTE — Progress Notes (Addendum)
Discharge instructions explained to dtr using teachback. Prescription given.

## 2013-07-02 NOTE — Progress Notes (Signed)
CSW confirmed address with patient. Completed PTAR form. Floor to call when hospital bed if delivered to patient's home.  Danielle Bullock C. [place] MSW, [place]

## 2013-07-03 NOTE — Consult Note (Signed)
I have reviewed and discussed the care of this patient in detail with the nurse practitioner including pertinent patient records, physical exam findings and data. I agree with details of this encounter.  

## 2013-07-03 NOTE — Progress Notes (Signed)
I have reviewed and discussed the care of this patient in detail with the nurse practitioner including pertinent patient records, physical exam findings and data. I agree with details of this encounter.  

## 2013-07-08 ENCOUNTER — Telehealth: Payer: Self-pay | Admitting: *Deleted

## 2013-07-08 NOTE — Telephone Encounter (Signed)
Received call from [place]. Per Dr. Humphrey Rolls, do not continue Letrozole 2.5 mg.

## 2013-07-19 ENCOUNTER — Telehealth: Payer: Self-pay | Admitting: Oncology

## 2013-07-19 ENCOUNTER — Encounter (INDEPENDENT_AMBULATORY_CARE_PROVIDER_SITE_OTHER): Payer: Self-pay

## 2013-07-19 ENCOUNTER — Other Ambulatory Visit (HOSPITAL_BASED_OUTPATIENT_CLINIC_OR_DEPARTMENT_OTHER)

## 2013-07-19 ENCOUNTER — Ambulatory Visit (HOSPITAL_BASED_OUTPATIENT_CLINIC_OR_DEPARTMENT_OTHER): Admitting: Oncology

## 2013-07-19 VITALS — BP 152/77 | HR 97 | Temp 98.4°F | Resp 18 | Ht 63.0 in | Wt 134.3 lb

## 2013-07-19 DIAGNOSIS — Z853 Personal history of malignant neoplasm of breast: Secondary | ICD-10-CM

## 2013-07-19 DIAGNOSIS — C801 Malignant (primary) neoplasm, unspecified: Secondary | ICD-10-CM

## 2013-07-19 DIAGNOSIS — C787 Secondary malignant neoplasm of liver and intrahepatic bile duct: Secondary | ICD-10-CM

## 2013-07-19 DIAGNOSIS — C50919 Malignant neoplasm of unspecified site of unspecified female breast: Secondary | ICD-10-CM

## 2013-07-19 DIAGNOSIS — C7A00 Malignant carcinoid tumor of unspecified site: Secondary | ICD-10-CM

## 2013-07-19 DIAGNOSIS — C799 Secondary malignant neoplasm of unspecified site: Secondary | ICD-10-CM

## 2013-07-19 LAB — CBC WITH DIFFERENTIAL/PLATELET
BASO%: 0.2 % (ref 0.0–2.0)
BASOS ABS: 0 10*3/uL (ref 0.0–0.1)
EOS%: 1.6 % (ref 0.0–7.0)
Eosinophils Absolute: 0.1 10*3/uL (ref 0.0–0.5)
HCT: 41.4 % (ref 34.8–46.6)
HEMOGLOBIN: 13.5 g/dL (ref 11.6–15.9)
LYMPH#: 2.8 10*3/uL (ref 0.9–3.3)
LYMPH%: 44.5 % (ref 14.0–49.7)
MCH: 30.1 pg (ref 25.1–34.0)
MCHC: 32.6 g/dL (ref 31.5–36.0)
MCV: 92.2 fL (ref 79.5–101.0)
MONO#: 0.7 10*3/uL (ref 0.1–0.9)
MONO%: 10.4 % (ref 0.0–14.0)
NEUT%: 43.3 % (ref 38.4–76.8)
NEUTROS ABS: 2.7 10*3/uL (ref 1.5–6.5)
Platelets: 276 10*3/uL (ref 145–400)
RBC: 4.49 10*6/uL (ref 3.70–5.45)
RDW: 14.1 % (ref 11.2–14.5)
WBC: 6.3 10*3/uL (ref 3.9–10.3)

## 2013-07-19 LAB — COMPREHENSIVE METABOLIC PANEL (CC13)
ALBUMIN: 3.6 g/dL (ref 3.5–5.0)
ALK PHOS: 92 U/L (ref 40–150)
ALT: 16 U/L (ref 0–55)
AST: 27 U/L (ref 5–34)
Anion Gap: 13 mEq/L — ABNORMAL HIGH (ref 3–11)
BUN: 7.3 mg/dL (ref 7.0–26.0)
CALCIUM: 9.4 mg/dL (ref 8.4–10.4)
CHLORIDE: 103 meq/L (ref 98–109)
CO2: 26 mEq/L (ref 22–29)
Creatinine: 1.1 mg/dL (ref 0.6–1.1)
Glucose: 85 mg/dl (ref 70–140)
POTASSIUM: 3.8 meq/L (ref 3.5–5.1)
SODIUM: 142 meq/L (ref 136–145)
TOTAL PROTEIN: 7.5 g/dL (ref 6.4–8.3)
Total Bilirubin: 0.5 mg/dL (ref 0.20–1.20)

## 2013-07-19 MED ORDER — LETROZOLE 2.5 MG PO TABS: 2.5000 mg | ORAL_TABLET | Freq: Every day | ORAL | Status: DC

## 2013-07-19 NOTE — Telephone Encounter (Signed)
, °

## 2013-07-20 ENCOUNTER — Encounter: Payer: Self-pay | Admitting: *Deleted

## 2013-07-20 NOTE — Progress Notes (Signed)
WENDY GRAHAM,RN CALLED. SHE HAS SPOKE WITH DR.HERTWECK. THERE IS SOME CONCERN ABOUT PT. BEING ON HOSPICE AND TAKING THE LETROZOLE. DR.HERTWECK WILL CONTACT DR.KHAN. NOTIFIED DR.KHAN.

## 2013-07-20 NOTE — Progress Notes (Signed)
Spoke with patient's daughter Danielle Bullock and told her that Danielle Bullock has 6 refills on the Femara prescription filled in 9-14.  Daughter will get refill through [facility].   Informed daughter that Dr. Humphrey Rolls feels that her mother is not hospice appropriate which is a good.  Hospice services may stop as her mother cannot be on active  treatment for the breast cancer and have hospice services.Daugher verbalized understanding. Called [facility] on [address] prescription can be cancelled.

## 2013-07-20 NOTE — Progress Notes (Addendum)
RECEIVED A FAX FROM [facility] CONCERNING A PRIOR AUTHORIZATION FOR LETROZOLE ON THIS HOSPICE PT. CALLED HOSPICE AND LEFT A MESSAGE FOR WENDY GRAHAM,RN TO RETURN THE CALL.

## 2013-07-23 ENCOUNTER — Ambulatory Visit (INDEPENDENT_AMBULATORY_CARE_PROVIDER_SITE_OTHER): Payer: Medicare Other | Admitting: Adult Health

## 2013-07-23 ENCOUNTER — Encounter: Payer: Self-pay | Admitting: Adult Health

## 2013-07-23 ENCOUNTER — Encounter: Payer: Self-pay | Admitting: Internal Medicine

## 2013-07-23 ENCOUNTER — Other Ambulatory Visit: Payer: Self-pay | Admitting: *Deleted

## 2013-07-23 ENCOUNTER — Telehealth: Payer: Self-pay | Admitting: Pulmonary Disease

## 2013-07-23 VITALS — BP 136/86 | HR 100 | Temp 98.9°F | Ht 63.0 in | Wt 133.2 lb

## 2013-07-23 DIAGNOSIS — J449 Chronic obstructive pulmonary disease, unspecified: Secondary | ICD-10-CM

## 2013-07-23 DIAGNOSIS — C799 Secondary malignant neoplasm of unspecified site: Secondary | ICD-10-CM

## 2013-07-23 DIAGNOSIS — N184 Chronic kidney disease, stage 4 (severe): Secondary | ICD-10-CM

## 2013-07-23 NOTE — Progress Notes (Signed)
   Subjective:    Patient ID: Danielle Bullock, female    DOB: 05-15-1922, 80 y.o.   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     07/23/2013 Acute OV  Complains of  chest congestion, prod cough with clear mucus, increased SOB, wheezing w/ exertion, chest tightness. Symptoms are on/off. Mucus is thick at times.  Seems to be worse with eating and at night.  No fever, or discolored mucus.  Not using Neb on regular basis.  Recent admit 06/28/13  For dehydration , discharged on pallaitive /hospice care. CXR on 12/29 w/ NAD.  Has seen ENT for vocal cord paralysis, is debating Tracheostomy.  Is eating regular food. At times intermittent dysphagia .    Review of Systems Constitutional:   No  weight loss, night sweats,  Fevers, chills,  +fatigue, or  lassitude.  HEENT:   No headaches,  Difficulty swallowing,  Tooth/dental problems, or  Sore throat,                No sneezing, itching, ear ache,  +nasal congestion, post nasal drip,   CV:  No chest pain,  Orthopnea, PND, swelling in lower extremities, anasarca, dizziness, palpitations, syncope.   GI  No heartburn, indigestion, abdominal pain, nausea, vomiting, diarrhea, change in bowel habits, loss of appetite, bloody stools.   Resp:  .  No chest wall deformity  Skin: no rash or lesions.  GU: no dysuria, change in color of urine, no urgency or frequency.  No flank pain, no hematuria   MS:  No joint pain or swelling.  No decreased range of motion.  No back pain.  Psych:  No change in mood or affect. No depression or anxiety.  No memory loss.         Objective:   Physical Exam GEN: A/Ox3; pleasant , NAD , elderly , very hoarse w/ spastic dysphonia  Eating a chicken sandwich   HEENT:  [place]/AT,  EACs-clear, TMs-wnl, NOSE-clear, THROAT-clear, no lesions, no postnasal drip or exudate noted.   NECK:   Supple w/ fair ROM; no JVD; normal carotid impulses w/o bruits; no thyromegaly or nodules palpated; no lymphadenopathy.  RESP  Few rhonchi , no wheeze , upper airway psuedowheeze w/o stridor noted. , no accessory muscle use, no dullness to percussion  CARD:  RRR, no m/r/g  , no peripheral edema, pulses intact, no cyanosis or clubbing.  GI:   Soft & nt; nml bowel sounds; no organomegaly or masses detected.  Musco: Warm bil, no deformities or joint swelling noted.   Neuro: alert, no focal deficits noted.    Skin: Warm, no lesions or rashes         Assessment & Plan:

## 2013-07-23 NOTE — Telephone Encounter (Signed)
Spoke with daughter. She reports pt is having chest congestion and can't cough anything up. She is choking on her phlem. Pt is scheduled to come in and see TP this afternoon at 2:30. Nothing further needed

## 2013-07-23 NOTE — Progress Notes (Signed)
Ov reviewed and agree with plan as outlined.

## 2013-07-23 NOTE — Progress Notes (Signed)
Rec'd call from Cleotis Nipper, RN with hospice, that there was misunderstanding with the hospital physician and Dr [facility] and that this patient is not ready for Hospice services and she will be revoking them immediately today. Patient needs orders to be sent for [facility] to start services for nursing and nurse aide as well as some DME. Orders to be sent to [facility] at this time.

## 2013-07-23 NOTE — Assessment & Plan Note (Signed)
Moderate COPD  Would add liquid Mucinex to help with thick mucus.  Restart Nebs as directed by Dr. Gwenette Greet to help with dyspnea   Plan  Restart Nebs  Take albuterol and ipratropium Three times a day   Continue on budesonide to the morning and evening treatments. Can take 2 additional treatments of the albuterol/ipratropium if having a bad day. Can use albuterol inhaler for rescue when away from home. May use Kids dose of Mucinex Liquid As needed  For thick mucus.  Follow up Dr. Gwenette Greet in 3-4 weeks and As needed

## 2013-07-23 NOTE — Patient Instructions (Signed)
Restart Nebs  Take albuterol and ipratropium Three times a day   Continue on budesonide to the morning and evening treatments. Can take 2 additional treatments of the albuterol/ipratropium if having a bad day. Can use albuterol inhaler for rescue when away from home. May use Kids dose of Mucinex Liquid As needed  For thick mucus.  Follow up Dr. Gwenette Greet in 3-4 weeks and As needed

## 2013-07-27 ENCOUNTER — Encounter: Payer: Self-pay | Admitting: Oncology

## 2013-07-27 NOTE — Progress Notes (Signed)
OFFICE PROGRESS NOTE  CC  Danielle Chessman, MD [place] [address] [address], [place]  DIAGNOSIS: 80 year old female with metastatic carcinoma unknown primary  STAGE:  Stage IV with liver mets     PRIOR THERAPY: #1 With multiple medical problems including COPD congestive heart failure coronary artery disease. Patient developed hoarseness for about a 3 month period. She went to be in 2 for an evaluation and was found to have one of local cord paralysis. She subsequently had CT scans performed of the chest. These scans failed to show any definite mediastinal adenopathy or other primary pathology but it did show multifocal bilateral nodules in both lungs. In the liver she was found to have a 2.5 x 3 cm lesion suspicious for metastatic disease.   #2 Patient apparently has a history of having had breast cancer about 10 years ago which was treated with a left mastectomy and 5 years of tamoxifen. Because of the liver lesion patient had a biopsy performed that showed carcinoma with some neuroendocrine features. Unknown primary. Patient had CA 27-29 marker that was moderately elevated 74.3 CEA 10.3 and Chromogranin A. Was borderline elevated at 7 an alpha-fetoprotein was negative.special stains were performed on the biopsy including EGF mutation which was negative therefore making it difficult to treat with Tarceva or a thick neck. She was also HER-2/neu negative. Past ID gene RA was sent but there was no time to be completed. Patient has relocated to [facility]. Patient's original oncologist recommended that he patient to put on aromatase inhibitor I letrozole 2.5 mg daily. Other 8 discussion was chemotherapy for lung cancer with pemetrexed and carboplatinum or pemetrexed alone. Patient did not want to have chemotherapy.     CURRENT THERAPY:letrozole 2.5 mg daily  INTERVAL HISTORY: Danielle Bullock 80 y.o. female returns for follow up visit. She  was recently hospitalized for respiratory distress. During that patient was placed on hospice. She now comes in to see me regarding continuation of her letrozole and other needs. I had a long discussion with the patient and her daughter. They woould like to continue the letrozole. She has been tolerating it well. She does have ongoing breathing problems. She is short of breath, she is using oxygen. She also is very hoarse. She also tellls me that she is planning on goin back to [place]. Her family is naturally opposed to it. She has no aches or pain, no nausea or vomting no fevers or chill no headaches. Remainder of the 10 point review of systems is negative.  MEDICAL HISTORY: Past Medical History  Diagnosis Date  . Cancer     breast/lung/liver  . Cancer, metastatic   . COPD (chronic obstructive pulmonary disease)   . CHF (congestive heart failure)   . Complication of anesthesia     hard to wake up  . Hypertension   . Coronary artery disease     ALLERGIES:  has No Known Allergies.  MEDICATIONS:  Current Outpatient Prescriptions  Medication Sig Dispense Refill  . albuterol (PROVENTIL) (5 MG/ML) 0.5% nebulizer solution Take 0.5 mLs (2.5 mg total) by nebulization every 6 (six) hours as needed for wheezing or shortness of breath. DX 496  360 mL  3  . omeprazole (PRILOSEC) 20 MG capsule Take 20 mg by mouth 2 (two) times daily before a meal.      . Eszopiclone (ESZOPICLONE) 3 MG TABS Take 1 tablet (3 mg total) by mouth at bedtime. Take immediately before bedtime when  necessary for insomnia  90 tablet  3  . letrozole (FEMARA) 2.5 MG tablet Take 1 tablet (2.5 mg total) by mouth daily.  30 tablet  6  . Morphine Sulfate (MORPHINE CONCENTRATE) 10 mg / 0.5 ml concentrated solution Take 0.25 mLs (5 mg total) by mouth every 4 (four) hours as needed for moderate pain, severe pain or shortness of breath.  1 Bottle  0   No current facility-administered medications for this visit.    SURGICAL HISTORY:   Past Surgical History  Procedure Laterality Date  . Thyroid surgery    . Cardiac surgery    . Abdominal surgery    . Breast surgery    . Small intestine surgery      REVIEW OF SYSTEMS:  Pertinent items are noted in HPI.     PHYSICAL EXAMINATION: Blood pressure 152/77, pulse 97, temperature 98.4 F (36.9 C), temperature source Oral, resp. rate 18, height '5\' 3"'  (1.6 m), weight 134 lb 4.8 oz (60.918 kg). Body mass index is 23.8 kg/(m^2). ECOG PERFORMANCE STATUS: 2 - Symptomatic, <50% confined to bed   General appearance: alert, fatigued, flushed and moderate distress Resp: bilaterally diminished breath sounds, with wheezes and upper airway transmitted sounds Cardio: regular rate and rhythm GI: soft, non-tender; bowel sounds normal; no masses,  no organomegaly Extremities: extremities normal, atraumatic, no cyanosis or edema Neurologic: Grossly normal   LABORATORY DATA: Lab Results  Component Value Date   WBC 6.3 07/19/2013   HGB 13.5 07/19/2013   HCT 41.4 07/19/2013   MCV 92.2 07/19/2013   PLT 276 07/19/2013      Chemistry      Component Value Date/Time   NA 142 07/19/2013 1526   NA 148* 07/01/2013 0553   K 3.8 07/19/2013 1526   K 3.5* 07/01/2013 0553   CL 109 07/01/2013 0553   CO2 26 07/19/2013 1526   CO2 23 07/01/2013 0553   BUN 7.3 07/19/2013 1526   BUN 11 07/01/2013 0553   CREATININE 1.1 07/19/2013 1526   CREATININE 1.21* 07/01/2013 0553      Component Value Date/Time   CALCIUM 9.4 07/19/2013 1526   CALCIUM 9.7 07/01/2013 0553   ALKPHOS 92 07/19/2013 1526   ALKPHOS 83 06/28/2013 1520   AST 27 07/19/2013 1526   AST 34 06/28/2013 1520   ALT 16 07/19/2013 1526   ALT 25 06/28/2013 1520   BILITOT 0.50 07/19/2013 1526   BILITOT 0.8 06/28/2013 1520       RADIOGRAPHIC STUDIES:  Dg Chest Portable 1 View  06/28/2013   CLINICAL DATA:  Generalized weakness  EXAM: PORTABLE CHEST - 1 VIEW  COMPARISON:  06/02/2013  FINDINGS: There is elevation of the left hemidiaphragm. Patient is status  post median sternotomy coronary artery bypass grafting. Atherosclerotic calcifications are appreciated within the aorta. The cardiac silhouette standing upper limits of normal. The lungs are clear. The osseous structures unremarkable.  IMPRESSION: No evidence of acute cardiopulmonary disease.   Electronically Signed   By: Margaree Mackintosh M.D.   On: 06/28/2013 15:15    ASSESSMENT/PLAN: 80 year old female with  1. Metastatic breast cancer on letrozole, overall she is tolerating it well. We will plan on continuing it for now. She is interested in knowing how her cancer is doing and I do think that it would be important to know the response to therapy and therefore I have ordered CT scans for her to be done in 2 months time  2. Vocal cord paralysis: she has been seen  by ENT and a possible recommendation for a tracheostomy but currently patient is stable and we can monitor.    3. Hospice: patient is currently on hospice however due to the fact she wants to be treated we will speak to hospice team regarding this.  4. Patient will return in 3 months for follow up with scans and blood work.  All questions were answered. The patient knows to call the clinic with any problems, questions or concerns. We can certainly see the patient much sooner if necessary.  I spent 20 minutes counseling the patient face to face. The total time spent in the appointment was 30 minutes.    Marcy Panning, MD Medical/Oncology [facility] [phone] (beeper) [phone] (Office)

## 2013-07-28 ENCOUNTER — Telehealth: Payer: Self-pay | Admitting: Oncology

## 2013-07-28 NOTE — Telephone Encounter (Signed)
m, °

## 2013-07-29 ENCOUNTER — Ambulatory Visit: Payer: Medicare Other | Admitting: Oncology

## 2013-07-29 ENCOUNTER — Telehealth: Payer: Self-pay | Admitting: Internal Medicine

## 2013-07-29 ENCOUNTER — Other Ambulatory Visit: Payer: Medicare Other

## 2013-07-29 NOTE — Telephone Encounter (Signed)
Pt would like to discontinue care because she has opted for a PCP closer to her house.

## 2013-07-30 ENCOUNTER — Ambulatory Visit: Payer: Medicare Other | Admitting: Internal Medicine

## 2013-08-16 ENCOUNTER — Ambulatory Visit: Payer: Medicare Other | Admitting: Pulmonary Disease

## 2013-08-18 ENCOUNTER — Encounter (HOSPITAL_COMMUNITY): Payer: Self-pay | Admitting: Emergency Medicine

## 2013-08-18 ENCOUNTER — Emergency Department (HOSPITAL_COMMUNITY)
Admission: EM | Admit: 2013-08-18 | Discharge: 2013-08-18 | Disposition: A | Discharge status: Home / Self Care | Payer: Medicare Other | Attending: Emergency Medicine | Admitting: Emergency Medicine

## 2013-08-18 ENCOUNTER — Emergency Department (HOSPITAL_COMMUNITY): Payer: Medicare Other

## 2013-08-18 DIAGNOSIS — Z9889 Other specified postprocedural states: Secondary | ICD-10-CM | POA: Insufficient documentation

## 2013-08-18 DIAGNOSIS — Z853 Personal history of malignant neoplasm of breast: Secondary | ICD-10-CM | POA: Insufficient documentation

## 2013-08-18 DIAGNOSIS — IMO0002 Reserved for concepts with insufficient information to code with codable children: Secondary | ICD-10-CM | POA: Insufficient documentation

## 2013-08-18 DIAGNOSIS — Z8505 Personal history of malignant neoplasm of liver: Secondary | ICD-10-CM | POA: Insufficient documentation

## 2013-08-18 DIAGNOSIS — Z87891 Personal history of nicotine dependence: Secondary | ICD-10-CM | POA: Insufficient documentation

## 2013-08-18 DIAGNOSIS — I10 Essential (primary) hypertension: Secondary | ICD-10-CM | POA: Insufficient documentation

## 2013-08-18 DIAGNOSIS — I2510 Atherosclerotic heart disease of native coronary artery without angina pectoris: Secondary | ICD-10-CM | POA: Insufficient documentation

## 2013-08-18 DIAGNOSIS — E876 Hypokalemia: Secondary | ICD-10-CM

## 2013-08-18 DIAGNOSIS — I509 Heart failure, unspecified: Secondary | ICD-10-CM | POA: Insufficient documentation

## 2013-08-18 DIAGNOSIS — J209 Acute bronchitis, unspecified: Secondary | ICD-10-CM

## 2013-08-18 DIAGNOSIS — F411 Generalized anxiety disorder: Secondary | ICD-10-CM | POA: Insufficient documentation

## 2013-08-18 DIAGNOSIS — C7800 Secondary malignant neoplasm of unspecified lung: Secondary | ICD-10-CM | POA: Insufficient documentation

## 2013-08-18 DIAGNOSIS — Z79899 Other long term (current) drug therapy: Secondary | ICD-10-CM | POA: Insufficient documentation

## 2013-08-18 DIAGNOSIS — J441 Chronic obstructive pulmonary disease with (acute) exacerbation: Secondary | ICD-10-CM | POA: Insufficient documentation

## 2013-08-18 LAB — BASIC METABOLIC PANEL
BUN: 10 mg/dL (ref 6–23)
CO2: 23 meq/L (ref 19–32)
CREATININE: 1.13 mg/dL — AB (ref 0.50–1.10)
Calcium: 9.8 mg/dL (ref 8.4–10.5)
Chloride: 99 mEq/L (ref 96–112)
GFR calc Af Amer: 48 mL/min — ABNORMAL LOW (ref >90)
GFR calc non Af Amer: 42 mL/min — ABNORMAL LOW (ref >90)
Glucose, Bld: 142 mg/dL — ABNORMAL HIGH (ref 70–99)
Potassium: 3 mEq/L — ABNORMAL LOW (ref 3.7–5.3)
Sodium: 141 mEq/L (ref 137–147)

## 2013-08-18 LAB — CBC WITH DIFFERENTIAL/PLATELET
BASOS ABS: 0 10*3/uL (ref 0.0–0.1)
Basophils Relative: 0 % (ref 0–1)
EOS PCT: 1 % (ref 0–5)
Eosinophils Absolute: 0 10*3/uL (ref 0.0–0.7)
HEMATOCRIT: 42.6 % (ref 36.0–46.0)
Hemoglobin: 14.2 g/dL (ref 12.0–15.0)
LYMPHS PCT: 36 % (ref 12–46)
Lymphs Abs: 3.1 10*3/uL (ref 0.7–4.0)
MCH: 31.1 pg (ref 26.0–34.0)
MCHC: 33.3 g/dL (ref 30.0–36.0)
MCV: 93.2 fL (ref 78.0–100.0)
MONO ABS: 0.4 10*3/uL (ref 0.1–1.0)
Monocytes Relative: 4 % (ref 3–12)
Neutro Abs: 5.2 10*3/uL (ref 1.7–7.7)
Neutrophils Relative %: 60 % (ref 43–77)
Platelets: 242 10*3/uL (ref 150–400)
RBC: 4.57 MIL/uL (ref 3.87–5.11)
RDW: 14.4 % (ref 11.5–15.5)
WBC: 8.7 10*3/uL (ref 4.0–10.5)

## 2013-08-18 MED ORDER — ACETAMINOPHEN 325 MG PO TABS
650.0000 mg | ORAL_TABLET | Freq: Once | ORAL | Status: AC
  Administered 2013-08-18: 650 mg via ORAL
  Filled 2013-08-18: qty 2

## 2013-08-18 MED ORDER — DM-GUAIFENESIN ER 30-600 MG PO TB12: 1.0000 | ORAL_TABLET | Freq: Two times a day (BID) | ORAL | Status: DC

## 2013-08-18 MED ORDER — ALBUTEROL SULFATE (2.5 MG/3ML) 0.083% IN NEBU
5.0000 mg | INHALATION_SOLUTION | Freq: Once | RESPIRATORY_TRACT | Status: AC
  Administered 2013-08-18: 5 mg via RESPIRATORY_TRACT

## 2013-08-18 MED ORDER — AZITHROMYCIN 250 MG PO TABS: ORAL_TABLET | ORAL | Status: DC

## 2013-08-18 MED ORDER — ALBUTEROL (5 MG/ML) CONTINUOUS INHALATION SOLN
INHALATION_SOLUTION | RESPIRATORY_TRACT | Status: AC
  Filled 2013-08-18: qty 20

## 2013-08-18 MED ORDER — POTASSIUM CHLORIDE CRYS ER 20 MEQ PO TBCR
40.0000 meq | EXTENDED_RELEASE_TABLET | Freq: Once | ORAL | Status: AC
  Administered 2013-08-18: 40 meq via ORAL
  Filled 2013-08-18: qty 2

## 2013-08-18 MED ORDER — PREDNISONE 20 MG PO TABS
60.0000 mg | ORAL_TABLET | Freq: Once | ORAL | Status: AC
  Administered 2013-08-18: 60 mg via ORAL
  Filled 2013-08-18: qty 3

## 2013-08-18 MED ORDER — DM-GUAIFENESIN ER 30-600 MG PO TB12
1.0000 | ORAL_TABLET | Freq: Once | ORAL | Status: AC
  Administered 2013-08-18: 1 via ORAL
  Filled 2013-08-18: qty 1

## 2013-08-18 MED ORDER — PREDNISONE 20 MG PO TABS: ORAL_TABLET | ORAL | Status: DC

## 2013-08-18 MED ORDER — ALBUTEROL (5 MG/ML) CONTINUOUS INHALATION SOLN
15.0000 mg/h | INHALATION_SOLUTION | RESPIRATORY_TRACT | Status: DC
  Administered 2013-08-18: 15 mg/h via RESPIRATORY_TRACT
  Filled 2013-08-18: qty 20

## 2013-08-18 MED ORDER — ALBUTEROL SULFATE (2.5 MG/3ML) 0.083% IN NEBU
5.0000 mg | INHALATION_SOLUTION | Freq: Once | RESPIRATORY_TRACT | Status: AC
  Administered 2013-08-18: 5 mg via RESPIRATORY_TRACT
  Filled 2013-08-18: qty 6

## 2013-08-18 MED ORDER — TRAMADOL HCL 50 MG PO TABS
100.0000 mg | ORAL_TABLET | Freq: Once | ORAL | Status: AC
  Administered 2013-08-18: 100 mg via ORAL
  Filled 2013-08-18: qty 2

## 2013-08-18 MED ORDER — IPRATROPIUM BROMIDE 0.02 % IN SOLN
0.5000 mg | Freq: Once | RESPIRATORY_TRACT | Status: AC
  Administered 2013-08-18: 0.5 mg via RESPIRATORY_TRACT
  Filled 2013-08-18: qty 2.5

## 2013-08-18 NOTE — ED Provider Notes (Signed)
9:21 AM Patient sleeping after last neb treatment. She awakens easily. Lung sounds are clear. Patient and daughter request D/C.   Carmin Muskrat, MD 08/18/13 [phone]

## 2013-08-18 NOTE — ED Notes (Signed)
Continuous neb in progress. Pt states she is feeling better. Continues to have stridor noted. Continue close observation.

## 2013-08-18 NOTE — Discharge Instructions (Signed)
Use your nebulizer for wheezing and shortness of breath. Increase your potassium pills to 40 meq a day for the next week then have Dr Gwenith Daily recheck your potassium level. Take the antibiotics and prednisone until gone. Take the mucinex DM for your cough. You will need to talk to Dr Gwenith Daily to get more hydrocodone pills. Recheck if you feel worse.

## 2013-08-18 NOTE — ED Provider Notes (Addendum)
CSN: [id number]     Arrival date & time 08/18/13  0506 History   First MD Initiated Contact with Patient 08/18/13 [phone]     Chief Complaint  Patient presents with  . Shortness of Breath     (Consider location/radiation/quality/duration/timing/severity/associated sxs/prior Treatment) HPI Patient reports she has lung cancer. She also has partial paralysis of her vocal cords. She reports about 5 AM she got up to go to the bathroom and had acute onset of shortness of breath. She states normally a nebulizer makes her symptoms better however she did not try her nebulizer. She reports she coughs all day yesterday with some yellow sputum production. She states her throat is sore. She denies fever or chills. She denies nausea, vomiting or diarrhea. Patient was given a nebulizer by EMS which she states has helped. EMS also gave her Solu-Medrol IV.  PCP Dr Gwenith Daily Pulmonologist Dr Gwenette Greet  Past Medical History  Diagnosis Date  . Cancer     breast/lung/liver  . Cancer, metastatic   . COPD (chronic obstructive pulmonary disease)   . CHF (congestive heart failure)   . Complication of anesthesia     hard to wake up  . Hypertension   . Coronary artery disease    Past Surgical History  Procedure Laterality Date  . Thyroid surgery    . Cardiac surgery    . Abdominal surgery    . Breast surgery    . Small intestine surgery     Family History  Problem Relation Age of Onset  . Hypertension Father   . Heart disease Father   . Hypertension Sister   . Hypertension Brother    History  Substance Use Topics  . Smoking status: Former Smoker -- 1.00 packs/day for 45 years    Types: Cigarettes    Quit date: 04/15/1969  . Smokeless tobacco: Never Used  . Alcohol Use: No   Pt lives with daughter  OB History   Grav Para Term Preterm Abortions TAB SAB Ect Mult Living                 Review of Systems  All other systems reviewed and are negative.      Allergies  Review of patient's  allergies indicates no known allergies.  Home Medications   Current Outpatient Rx  Name  Route  Sig  Dispense  Refill  . albuterol (PROVENTIL HFA;VENTOLIN HFA) 108 (90 BASE) MCG/ACT inhaler   Inhalation   Inhale 2 puffs into the lungs every 6 (six) hours as needed for wheezing or shortness of breath.         Marland Kitchen albuterol (PROVENTIL) (2.5 MG/3ML) 0.083% nebulizer solution   Nebulization   Take 2.5 mg by nebulization every 6 (six) hours as needed for wheezing or shortness of breath.         Marland Kitchen albuterol (PROVENTIL) (5 MG/ML) 0.5% nebulizer solution   Nebulization   Take 5 mg by nebulization every 6 (six) hours as needed for wheezing or shortness of breath.         Marland Kitchen amLODipine (NORVASC) 10 MG tablet   Oral   Take 10 mg by mouth daily.         . budesonide (PULMICORT) 0.5 MG/2ML nebulizer solution   Nebulization   Take 0.5 mg by nebulization 2 (two) times daily.         Marland Kitchen Dexlansoprazole (DEXILANT) 30 MG capsule   Oral   Take 30 mg by mouth daily as needed (heart  burn).         Marland Kitchen  dextromethorphan-guaiFENesin (MUCINEX DM) 30-600 MG per 12 hr tablet   Oral   Take 1 tablet by mouth every 4 (four) hours as needed for cough.         . Eszopiclone 3 MG TABS   Oral   Take 3 mg by mouth at bedtime. Take immediately before bedtime         . furosemide (LASIX) 40 MG tablet   Oral   Take 40 mg by mouth daily.         Marland Kitchen ipratropium (ATROVENT) 0.02 % nebulizer solution   Nebulization   Take 0.5 mg by nebulization every 4 (four) hours as needed for wheezing or shortness of breath.         . letrozole (FEMARA) 2.5 MG tablet   Oral   Take 2.5 mg by mouth daily.         . Morphine Sulfate (MORPHINE CONCENTRATE) 10 mg / 0.5 ml concentrated solution   Oral   Take 10 mg by mouth every 2 (two) hours as needed for severe pain.         . potassium chloride SA (K-DUR,KLOR-CON) 20 MEQ tablet   Oral   Take 20 mEq by mouth daily.         Marland Kitchen senna-docusate  (SENOKOT-S) 8.6-50 MG per tablet   Oral   Take 3 tablets by mouth at bedtime.          BP 141/75  Pulse 119  Temp(Src) 98.5 F (36.9 C) (Oral)  Resp 32  Ht 5\' 3"  (1.6 m)  Wt 131 lb (59.421 kg)  BMI 23.21 kg/m2  SpO2 99%  Vital signs normal except for tachycardia, tachypnea  Physical Exam  Nursing note and vitals reviewed. Constitutional: She is oriented to person, place, and time. She appears well-developed and well-nourished.  Non-toxic appearance. She does not appear ill. No distress.  HENT:  Head: Normocephalic and atraumatic.  Right Ear: External ear normal.  Left Ear: External ear normal.  Nose: Nose normal. No mucosal edema or rhinorrhea.  Mouth/Throat: Oropharynx is clear and moist and mucous membranes are normal. No dental abscesses or uvula swelling.  Eyes: Conjunctivae and EOM are normal. Pupils are equal, round, and reactive to light.  Neck: Normal range of motion and full passive range of motion without pain. Neck supple.  Cardiovascular: Normal rate, regular rhythm and normal heart sounds.  Exam reveals no gallop and no friction rub.   No murmur heard. Pulmonary/Chest: Accessory muscle usage present. Tachypnea noted. She is in respiratory distress. She has decreased breath sounds. She has no wheezes. She has rhonchi. She has no rales. She exhibits no tenderness and no crepitus.   Patient has a low pitched inspiratory stridorous sounds. However she is able to speak in sentences. She has transmitted upper airway noise into her lungs. She has increased in egophony and tactile fremitus on the right.  Abdominal: Soft. Normal appearance and bowel sounds are normal. She exhibits no distension. There is no tenderness. There is no rebound and no guarding.  Musculoskeletal: Normal range of motion. She exhibits no edema and no tenderness.  Moves all extremities well.   Neurological: She is alert and oriented to person, place, and time. She has normal strength. No cranial nerve  deficit.  Skin: Skin is warm, dry and intact. No rash noted. No erythema. No pallor.  Psychiatric: Her speech is normal and behavior is normal. Her mood appears anxious.    ED Course  Procedures (including critical care time)  Medications  albuterol (PROVENTIL,VENTOLIN) solution continuous neb (15 mg/hr Nebulization New Bag/Given 08/18/13 0545)  potassium chloride SA (K-DUR,KLOR-CON) CR tablet 40 mEq (not administered)  dextromethorphan-guaiFENesin (MUCINEX DM) 30-600 MG per 12 hr tablet 1 tablet (not administered)  traMADol (ULTRAM) tablet 100 mg (not administered)  acetaminophen (TYLENOL) tablet 650 mg (not administered)  albuterol (PROVENTIL) (2.5 MG/3ML) 0.083% nebulizer solution 5 mg (not administered)  ipratropium (ATROVENT) nebulizer solution 0.5 mg (not administered)  predniSONE (DELTASONE) tablet 60 mg (not administered)  albuterol (PROVENTIL) (2.5 MG/3ML) 0.083% nebulizer solution 5 mg (5 mg Nebulization Given 08/18/13 0541)  albuterol (PROVENTIL, VENTOLIN) (5 MG/ML) 0.5% continuous inhalation solution (  Duplicate 02/01/20 2248)   Recheck 07:15 patient has finished her continuous nebulizer. She's no longer having the inspiratory stridorous noise. However when she coughs she does have it briefly. She is feeling improved with improved air movement. She still has some tachycardia. We discussed her potassium level which patient has had a problem with in the past been as low as 2.2. This may be from her frequent nebulizer treatments. She was just seen by her renal doctor last week and was told "everything was fine". We discussed increasing her potassium for the next week however to be cautious because of her mild renal insufficiency seen on her blood work Midwife. Patient was started on steroids.  Second nebulizer ordered but will wait because of patient still feeling tachycardia.  Pt will be reassessed by Dr Vanita Panda after her second nebulizer and discharged if improved and she feels  ready to be discharged.   Labs Review Results for orders placed during the hospital encounter of 08/18/13  CBC WITH DIFFERENTIAL      Result Value Ref Range   WBC 8.7  4.0 - 10.5 K/uL   RBC 4.57  3.87 - 5.11 MIL/uL   Hemoglobin 14.2  12.0 - 15.0 g/dL   HCT 42.6  36.0 - 46.0 %   MCV 93.2  78.0 - 100.0 fL   MCH 31.1  26.0 - 34.0 pg   MCHC 33.3  30.0 - 36.0 g/dL   RDW 14.4  11.5 - 15.5 %   Platelets 242  150 - 400 K/uL   Neutrophils Relative % 60  43 - 77 %   Neutro Abs 5.2  1.7 - 7.7 K/uL   Lymphocytes Relative 36  12 - 46 %   Lymphs Abs 3.1  0.7 - 4.0 K/uL   Monocytes Relative 4  3 - 12 %   Monocytes Absolute 0.4  0.1 - 1.0 K/uL   Eosinophils Relative 1  0 - 5 %   Eosinophils Absolute 0.0  0.0 - 0.7 K/uL   Basophils Relative 0  0 - 1 %   Basophils Absolute 0.0  0.0 - 0.1 K/uL  BASIC METABOLIC PANEL      Result Value Ref Range   Sodium 141  137 - 147 mEq/L   Potassium 3.0 (*) 3.7 - 5.3 mEq/L   Chloride 99  96 - 112 mEq/L   CO2 23  19 - 32 mEq/L   Glucose, Bld 142 (*) 70 - 99 mg/dL   BUN 10  6 - 23 mg/dL   Creatinine, Ser 1.13 (*) 0.50 - 1.10 mg/dL   Calcium 9.8  8.4 - 10.5 mg/dL   GFR calc non Af Amer 42 (*) >90 mL/min   GFR calc Af Amer 48 (*) >90 mL/min   No results found.   Imaging Review Dg Chest Portable 1 View  08/18/2013  CLINICAL DATA:  Metastatic cancer, hypertension, COPD and CHF now with mid chest pain, shortness of breath, cough.  EXAM: PORTABLE CHEST - 1 VIEW  COMPARISON:  DG CHEST 1V PORT dated 06/28/2013  FINDINGS: Cardiac silhouette appears mildly enlarged which may be accentuated by AP technique. Status post median sternotomy for apparent coronary artery bypass grafting. Moderate aortic calcification. No pleural effusions or focal consolidations. No pneumothorax the lung apices partially obscured by facial structures.  Surgical clips in left chest, left breast implant. Persistently mildly elevated left hemidiaphragm.  IMPRESSION: Mild cardiomegaly, no acute  pulmonary process.   Electronically Signed   By: Elon Alas   On: 08/18/2013 06:44    EKG Interpretation   None       MDM   Final diagnoses:  Bronchitis with bronchospasm  Hypokalemia    New Prescriptions   AZITHROMYCIN (ZITHROMAX) 250 MG TABLET    Take 2 po the first day then once a day for the next 4 days.   DEXTROMETHORPHAN-GUAIFENESIN (MUCINEX DM) 30-600 MG PER 12 HR TABLET    Take 1 tablet by mouth 2 (two) times daily.   PREDNISONE (DELTASONE) 20 MG TABLET    Take 3 po QD x 2d starting tomorrow, then 2 po QD x 3d then 1 po QD x 3d    Plan discharge   Rolland Porter, MD, Alanson Aly, MD 08/18/13 3403  Janice Norrie, MD 08/18/13 [phone]

## 2013-08-18 NOTE — ED Notes (Signed)
Arrived per EMS c/o SOB. EMS states went to bed c/o sob awoke with continued sob. EMS gave Solumedrol 125mg , albuterol/atrovent in route. Pt arrived with treatment continued. Pt grunting with obvious distress. Respiratory to bedside.

## 2013-08-18 NOTE — Progress Notes (Signed)
RN called RT for patient who was coming in with resp distress. When pt arrived pt had audible strider ( pt has paralyzed vocal cords per RN ). EMS placed pt on a albuterol treatment in route to the ED. Patient also has exp. Wheezing in all lobes and is diminished in lower lobes. Waiting on MD to see pt.

## 2013-08-24 ENCOUNTER — Ambulatory Visit: Payer: Medicare Other | Admitting: Pulmonary Disease

## 2013-08-27 ENCOUNTER — Ambulatory Visit (HOSPITAL_COMMUNITY): Payer: Medicare Other

## 2013-09-06 ENCOUNTER — Encounter: Payer: Self-pay | Admitting: Pulmonary Disease

## 2013-09-06 ENCOUNTER — Ambulatory Visit (INDEPENDENT_AMBULATORY_CARE_PROVIDER_SITE_OTHER): Payer: Medicare Other | Admitting: Pulmonary Disease

## 2013-09-06 VITALS — BP 138/80 | HR 84 | Temp 98.0°F | Ht 63.0 in | Wt 135.0 lb

## 2013-09-06 DIAGNOSIS — J449 Chronic obstructive pulmonary disease, unspecified: Secondary | ICD-10-CM

## 2013-09-06 DIAGNOSIS — J3800 Paralysis of vocal cords and larynx, unspecified: Secondary | ICD-10-CM

## 2013-09-06 MED ORDER — IPRATROPIUM-ALBUTEROL 0.5-2.5 (3) MG/3ML IN SOLN: 3.0000 mL | Freq: Four times a day (QID) | RESPIRATORY_TRACT | Status: AC

## 2013-09-06 NOTE — Assessment & Plan Note (Addendum)
V/Q 03/2013:  No defects CXR 03/2013:  No acute process. Danielle Bullock 05/2013:  FEV1 0.80 (57%), ratio 61%   The patient is doing well since an acute exacerbation recently, and denies any significant chest congestion or purulent mucus. I have asked her to continue on her bronchodilator regimen.

## 2013-09-06 NOTE — Patient Instructions (Signed)
Will change your ipratropium/albuterol nebs to "duonebs" (all in one).  Continue to take one 4 times a day no matter what, and can take 2 more if having issues. Continue budesonide in 2 treatments a day. Continue to be careful with swallowing Can get your medical records to take with you to [facility] downstairs in basement.

## 2013-09-06 NOTE — Assessment & Plan Note (Signed)
The patient is still having a lot of upper airway symptoms, and I suspect this is significantly contributing to airway resistance and shortness of breath. Her lungs are totally clear today. I've also asked her to be careful with swallowing liquids and solids, since she is at high risk for aspiration pneumonia.

## 2013-09-06 NOTE — Progress Notes (Signed)
   Subjective:    Patient ID: Danielle Bullock, female    DOB: December 31, 1921, 80 y.o.   MRN: [id number]  HPI Patient comes in today for followup of her known moderate COPD, as well as cord paralysis. She had a recent episode of what sounds like a COPD exacerbation, and responded to antibiotics and prednisone.  She no longer has chest congestion or purulent mucus. She feels that her breathing is at her usual level, and is continuing to stay on her nebulized bronchodilators. The patient tells me today that she is moving back to [place].    Review of Systems  Constitutional: Negative for fever and unexpected weight change.  HENT: Positive for congestion ( chest). Negative for dental problem, ear pain, nosebleeds, postnasal drip, rhinorrhea, sinus pressure, sneezing, sore throat and trouble swallowing.   Eyes: Negative for redness and itching.  Respiratory: Positive for cough, chest tightness, shortness of breath and wheezing.        Gasping sound  Cardiovascular: Negative for palpitations and leg swelling.  Gastrointestinal: Negative for nausea and vomiting.  Genitourinary: Negative for dysuria.  Musculoskeletal: Negative for joint swelling.  Skin: Negative for rash.  Neurological: Negative for headaches.  Hematological: Does not bruise/bleed easily.  Psychiatric/Behavioral: Negative for dysphoric mood. The patient is not nervous/anxious.        Objective:   Physical Exam Well-developed female in no acute distress Nose without purulence or discharge noted Neck without lymphadenopathy or thyromegaly. Chest totally clear to auscultation with no wheezes or crackles, very prominent upper airway pseudo wheezing. Cardiac exam with regular rate and rhythm Lower extremities with mild ankle edema, no cyanosis Alert and oriented, moves all 4 extremities.       Assessment & Plan:

## 2013-09-07 ENCOUNTER — Ambulatory Visit (HOSPITAL_COMMUNITY)
Admission: RE | Admit: 2013-09-07 | Discharge: 2013-09-07 | Disposition: A | Discharge status: Home / Self Care | Payer: Medicare Other | Source: Ambulatory Visit | Attending: Oncology | Admitting: Oncology

## 2013-09-07 ENCOUNTER — Encounter (HOSPITAL_COMMUNITY): Payer: Self-pay

## 2013-09-07 DIAGNOSIS — C7952 Secondary malignant neoplasm of bone marrow: Secondary | ICD-10-CM

## 2013-09-07 DIAGNOSIS — Z9010 Acquired absence of unspecified breast and nipple: Secondary | ICD-10-CM | POA: Insufficient documentation

## 2013-09-07 DIAGNOSIS — C787 Secondary malignant neoplasm of liver and intrahepatic bile duct: Secondary | ICD-10-CM | POA: Insufficient documentation

## 2013-09-07 DIAGNOSIS — C7951 Secondary malignant neoplasm of bone: Secondary | ICD-10-CM | POA: Insufficient documentation

## 2013-09-07 DIAGNOSIS — C50919 Malignant neoplasm of unspecified site of unspecified female breast: Secondary | ICD-10-CM

## 2013-09-07 MED ORDER — IOHEXOL 300 MG/ML  SOLN
100.0000 mL | Freq: Once | INTRAMUSCULAR | Status: AC | PRN
  Administered 2013-09-07: 100 mL via INTRAVENOUS

## 2013-09-07 MED ORDER — IOHEXOL 300 MG/ML  SOLN
50.0000 mL | Freq: Once | INTRAMUSCULAR | Status: AC | PRN
  Administered 2013-09-07: 50 mL via ORAL

## 2013-09-14 ENCOUNTER — Other Ambulatory Visit (HOSPITAL_BASED_OUTPATIENT_CLINIC_OR_DEPARTMENT_OTHER): Payer: Medicare Other

## 2013-09-14 ENCOUNTER — Encounter: Payer: Self-pay | Admitting: Adult Health

## 2013-09-14 ENCOUNTER — Ambulatory Visit (HOSPITAL_BASED_OUTPATIENT_CLINIC_OR_DEPARTMENT_OTHER): Payer: Medicare Other | Admitting: Adult Health

## 2013-09-14 VITALS — BP 151/79 | HR 89 | Temp 98.6°F | Resp 19 | Ht 63.0 in | Wt 132.6 lb

## 2013-09-14 DIAGNOSIS — C50919 Malignant neoplasm of unspecified site of unspecified female breast: Secondary | ICD-10-CM

## 2013-09-14 DIAGNOSIS — C799 Secondary malignant neoplasm of unspecified site: Secondary | ICD-10-CM

## 2013-09-14 DIAGNOSIS — G839 Paralytic syndrome, unspecified: Secondary | ICD-10-CM

## 2013-09-14 DIAGNOSIS — C787 Secondary malignant neoplasm of liver and intrahepatic bile duct: Secondary | ICD-10-CM

## 2013-09-14 LAB — CBC WITH DIFFERENTIAL/PLATELET
BASO%: 0.5 % (ref 0.0–2.0)
Basophils Absolute: 0 10*3/uL (ref 0.0–0.1)
EOS%: 2.2 % (ref 0.0–7.0)
Eosinophils Absolute: 0.1 10*3/uL (ref 0.0–0.5)
HCT: 43 % (ref 34.8–46.6)
HGB: 14.1 g/dL (ref 11.6–15.9)
LYMPH%: 39 % (ref 14.0–49.7)
MCH: 30.7 pg (ref 25.1–34.0)
MCHC: 32.8 g/dL (ref 31.5–36.0)
MCV: 93.7 fL (ref 79.5–101.0)
MONO#: 0.6 10*3/uL (ref 0.1–0.9)
MONO%: 11.2 % (ref 0.0–14.0)
NEUT#: 2.7 10*3/uL (ref 1.5–6.5)
NEUT%: 47.1 % (ref 38.4–76.8)
PLATELETS: 267 10*3/uL (ref 145–400)
RBC: 4.59 10*6/uL (ref 3.70–5.45)
RDW: 14.9 % — ABNORMAL HIGH (ref 11.2–14.5)
WBC: 5.7 10*3/uL (ref 3.9–10.3)
lymph#: 2.2 10*3/uL (ref 0.9–3.3)

## 2013-09-14 LAB — COMPREHENSIVE METABOLIC PANEL (CC13)
ALBUMIN: 3.7 g/dL (ref 3.5–5.0)
ALT: 16 U/L (ref 0–55)
ANION GAP: 13 meq/L — AB (ref 3–11)
AST: 25 U/L (ref 5–34)
Alkaline Phosphatase: 119 U/L (ref 40–150)
BUN: 7 mg/dL (ref 7.0–26.0)
CHLORIDE: 104 meq/L (ref 98–109)
CO2: 25 mEq/L (ref 22–29)
Calcium: 9.9 mg/dL (ref 8.4–10.4)
Creatinine: 1.2 mg/dL — ABNORMAL HIGH (ref 0.6–1.1)
Glucose: 88 mg/dl (ref 70–140)
Potassium: 4.3 mEq/L (ref 3.5–5.1)
SODIUM: 142 meq/L (ref 136–145)
Total Bilirubin: 0.47 mg/dL (ref 0.20–1.20)
Total Protein: 7.5 g/dL (ref 6.4–8.3)

## 2013-09-14 NOTE — Progress Notes (Signed)
OFFICE PROGRESS NOTE  CC  Danielle Chessman, MD Danielle Bullock [place] [address] [address], [place]  DIAGNOSIS: 80 year old female with metastatic carcinoma unknown primary  STAGE:  Stage IV with liver mets  PRIOR THERAPY: #1 With multiple medical problems including COPD congestive heart failure coronary artery disease. Patient developed hoarseness for about a 3 month period. She went to be in 2 for an evaluation and was found to have one of local cord paralysis. She subsequently had CT scans performed of the chest. These scans failed to show any definite mediastinal adenopathy or other primary pathology but it did show multifocal bilateral nodules in both lungs. In the liver she was found to have a 2.5 x 3 cm lesion suspicious for metastatic disease.   #2 Patient apparently has a history of having had breast cancer about 10 years ago which was treated with a left mastectomy and 5 years of tamoxifen. Because of the liver lesion patient had a biopsy performed that showed carcinoma with some neuroendocrine features. Unknown primary. Patient had CA 27-29 marker that was moderately elevated 74.3 CEA 10.3 and Chromogranin A. Was borderline elevated at 7 an alpha-fetoprotein was negative.special stains were performed on the biopsy including EGF mutation which was negative therefore making it difficult to treat with Tarceva or a thick neck. She was also HER-2/neu negative. Past ID gene RA was sent but there was no time to be completed. Patient has relocated to [facility]. Patient's original oncologist recommended that he patient to put on aromatase inhibitor I letrozole 2.5 mg daily. Other 8 discussion was chemotherapy for lung cancer with pemetrexed and carboplatinum or pemetrexed alone. Patient did not want to have chemotherapy.   CURRENT THERAPY: letrozole 2.5 mg daily  INTERVAL HISTORY: Danielle Bullock 80 y.o. female returns for follow up visit today.  She is  doing well.  She is taking the letrozole daily and has occasional hot flashes.  Otherwise she denies joint aches or any other problems.  She continues to have the vocal cord paralysis, however she is managing it well.  She denies any new pain, night sweats, fevers, or any further concerns.  She is here with her daughter and they are requesting results from her recent scans.  She also tells me that she is moving to [place] in 10 days.  Otherwise, a 10 point ROS is neg.   MEDICAL HISTORY: Past Medical History  Diagnosis Date  . Cancer     breast/lung/liver  . Cancer, metastatic   . COPD (chronic obstructive pulmonary disease)   . CHF (congestive heart failure)   . Complication of anesthesia     hard to wake up  . Hypertension   . Coronary artery disease     ALLERGIES:  has No Known Allergies.  MEDICATIONS:  Current Outpatient Prescriptions  Medication Sig Dispense Refill  . albuterol (PROVENTIL HFA;VENTOLIN HFA) 108 (90 BASE) MCG/ACT inhaler Inhale 2 puffs into the lungs every 6 (six) hours as needed for wheezing or shortness of breath.      Marland Kitchen amLODipine (NORVASC) 10 MG tablet Take 10 mg by mouth daily.      . budesonide (PULMICORT) 0.5 MG/2ML nebulizer solution Take 0.5 mg by nebulization 2 (two) times daily.      . Eszopiclone 3 MG TABS Take 3 mg by mouth at bedtime. Take immediately before bedtime      . furosemide (LASIX) 40 MG tablet Take 40 mg by mouth daily.      Marland Kitchen  HYDROcodone-acetaminophen (NORCO/VICODIN) 5-325 MG per tablet Take 1 tablet by mouth every 6 (six) hours as needed for moderate pain.      Marland Kitchen ipratropium-albuterol (DUONEB) 0.5-2.5 (3) MG/3ML SOLN Take 3 mLs by nebulization 4 (four) times daily. May use 2 additional treatments PRN for increased symptoms.  380 mL  11  . letrozole (FEMARA) 2.5 MG tablet Take 2.5 mg by mouth daily.      . potassium chloride SA (K-DUR,KLOR-CON) 20 MEQ tablet Take 40 mEq by mouth daily.       Marland Kitchen senna-docusate (SENOKOT-S) 8.6-50 MG per tablet  Take 3 tablets by mouth at bedtime.      . Morphine Sulfate (MORPHINE CONCENTRATE) 10 mg / 0.5 ml concentrated solution Take 10 mg by mouth every 2 (two) hours as needed for severe pain.       No current facility-administered medications for this visit.    SURGICAL HISTORY:  Past Surgical History  Procedure Laterality Date  . Thyroid surgery    . Cardiac surgery    . Abdominal surgery    . Breast surgery    . Small intestine surgery      REVIEW OF SYSTEMS:  A 10 point review of systems was conducted and is otherwise negative except for what is noted above.       PHYSICAL EXAMINATION: Blood pressure 151/79, pulse 89, temperature 98.6 F (37 C), temperature source Oral, resp. rate 19, height '5\' 3"'  (1.6 m), weight 132 lb 9.6 oz (60.147 kg), SpO2 97.00%. Body mass index is 23.49 kg/(m^2). GENERAL: Patient is a chronically ill appearing female in no acute distress HEENT:  Sclerae anicteric.  Oropharynx clear and moist. No ulcerations or evidence of oropharyngeal candidiasis. Neck is supple.  NODES:  No cervical, supraclavicular, or axillary lymphadenopathy palpated.  BREAST EXAM:  Deferred. LUNGS:  Clear to auscultation bilaterally.  No wheezes or rhonchi. HEART:  Regular rate and rhythm. No murmur appreciated. ABDOMEN:  Soft, nontender.  Positive, normoactive bowel sounds. No organomegaly palpated. MSK:  No focal spinal tenderness to palpation. Full range of motion bilaterally in the upper extremities. EXTREMITIES:  No peripheral edema.   SKIN:  Clear with no obvious rashes or skin changes. No nail dyscrasia. NEURO:  Nonfocal. Well oriented.  Appropriate affect. ECOG PERFORMANCE STATUS: 2 - Symptomatic, <50% confined to bed      LABORATORY DATA: Lab Results  Component Value Date   WBC 5.7 09/14/2013   HGB 14.1 09/14/2013   HCT 43.0 09/14/2013   MCV 93.7 09/14/2013   PLT 267 09/14/2013      Chemistry      Component Value Date/Time   NA 142 09/14/2013 1010   NA 141 08/18/2013  0630   K 4.3 09/14/2013 1010   K 3.0* 08/18/2013 0630   CL 99 08/18/2013 0630   CO2 25 09/14/2013 1010   CO2 23 08/18/2013 0630   BUN 7.0 09/14/2013 1010   BUN 10 08/18/2013 0630   CREATININE 1.2* 09/14/2013 1010   CREATININE 1.13* 08/18/2013 0630      Component Value Date/Time   CALCIUM 9.9 09/14/2013 1010   CALCIUM 9.8 08/18/2013 0630   ALKPHOS 119 09/14/2013 1010   ALKPHOS 83 06/28/2013 1520   AST 25 09/14/2013 1010   AST 34 06/28/2013 1520   ALT 16 09/14/2013 1010   ALT 25 06/28/2013 1520   BILITOT 0.47 09/14/2013 1010   BILITOT 0.8 06/28/2013 1520       RADIOGRAPHIC STUDIES:  Dg Chest Portable 1 View  06/28/2013   CLINICAL DATA:  Generalized weakness  EXAM: PORTABLE CHEST - 1 VIEW  COMPARISON:  06/02/2013  FINDINGS: There is elevation of the left hemidiaphragm. Patient is status post median sternotomy coronary artery bypass grafting. Atherosclerotic calcifications are appreciated within the aorta. The cardiac silhouette standing upper limits of normal. The lungs are clear. The osseous structures unremarkable.  IMPRESSION: No evidence of acute cardiopulmonary disease.   Electronically Signed   By: Margaree Mackintosh M.D.   On: 06/28/2013 15:15    ASSESSMENT/PLAN: 80 year old female with  1. Metastatic breast cancer on letrozole, overall she is tolerating it well. She had CT chest abdomen pelvis on 09/07/13 and her disease was stable.  I reviewed these results with Dr. Humphrey Rolls, and the plan remains to continue her on daily Letrozole.    2. Vocal cord paralysis: she has been seen by ENT and a possible recommendation for a tracheostomy but currently patient is stable and we can monitor.    3.The patient is moving to [place] and this is her last appointment with [place] as she will be transferring her care to a different oncologist.  I have requested her medical records be copied and the patient has completed the necessary paperwork to do so.  Her daughter Danielle Bullock will pick them up this week.    All  questions were answered. The patient knows to call the clinic with any problems, questions or concerns. We can certainly see the patient much sooner if necessary.  I spent 25 minutes counseling the patient face to face. The total time spent in the appointment was 30 minutes.  Minette Headland, [place] [phone]

## 2013-09-14 NOTE — Patient Instructions (Signed)
Continue daily Letrozole.  We will see you back in 3 months.  Please call us if you have any questions or concerns.

## 2013-09-16 ENCOUNTER — Telehealth: Payer: Self-pay | Admitting: Pulmonary Disease

## 2013-09-16 NOTE — Telephone Encounter (Signed)
Form is on Ashtyn's desk. Will route to her to make sure [place] signs form tomorrow.

## 2013-09-17 NOTE — Telephone Encounter (Signed)
Dr Gwenette Greet please see this form in your Amarys Sliwinski folder. Thanks!

## 2013-09-17 NOTE — Telephone Encounter (Signed)
This form has been completed and faxed back to [place] in scan folder.

## 2013-09-23 ENCOUNTER — Telehealth: Payer: Self-pay | Admitting: Pulmonary Disease

## 2013-09-23 NOTE — Progress Notes (Signed)
[facility] request dtd 09/06/13 for DME.  Signed by KK and faxed back to [facility].  Sent to scan

## 2013-09-23 NOTE — Telephone Encounter (Signed)
Angie with [employer] calling requesting missing documents from form faxed 09/17/13 Requesting medlist and last OV. Faxed to [phone]

## 2013-11-01 ENCOUNTER — Encounter: Payer: Self-pay | Admitting: Pulmonary Disease

## 2013-11-10 ENCOUNTER — Ambulatory Visit: Payer: Medicare Other | Admitting: Pulmonary Disease

## 2013-12-22 IMAGING — CR DG NECK SOFT TISSUE
2 series · 2 of 2 positions shown · non-contrast
Comparison: Concurrently obtained chest x-ray

CLINICAL DATA: Short of breath, wheezing, hemoptysis to

EXAM:
NECK SOFT TISSUES - 1+ VIEW

[w soft tissue neck lat]
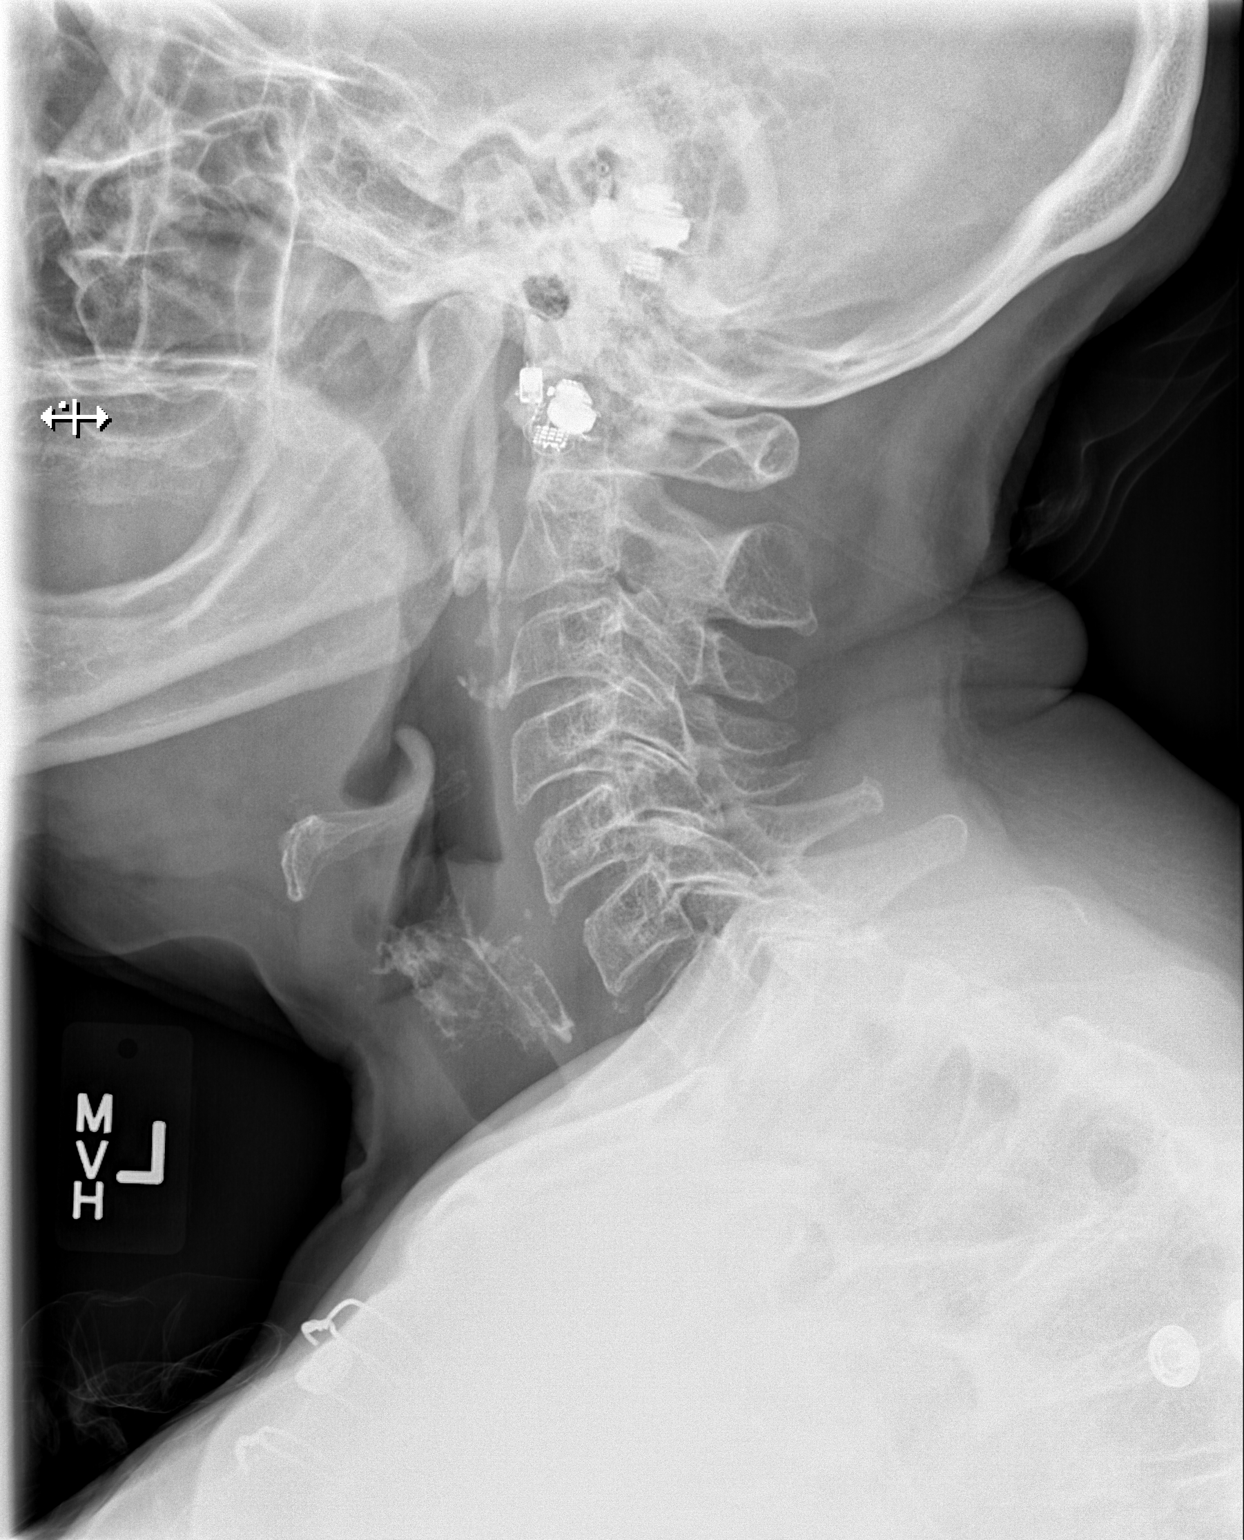

[x soft tissue neck lat]
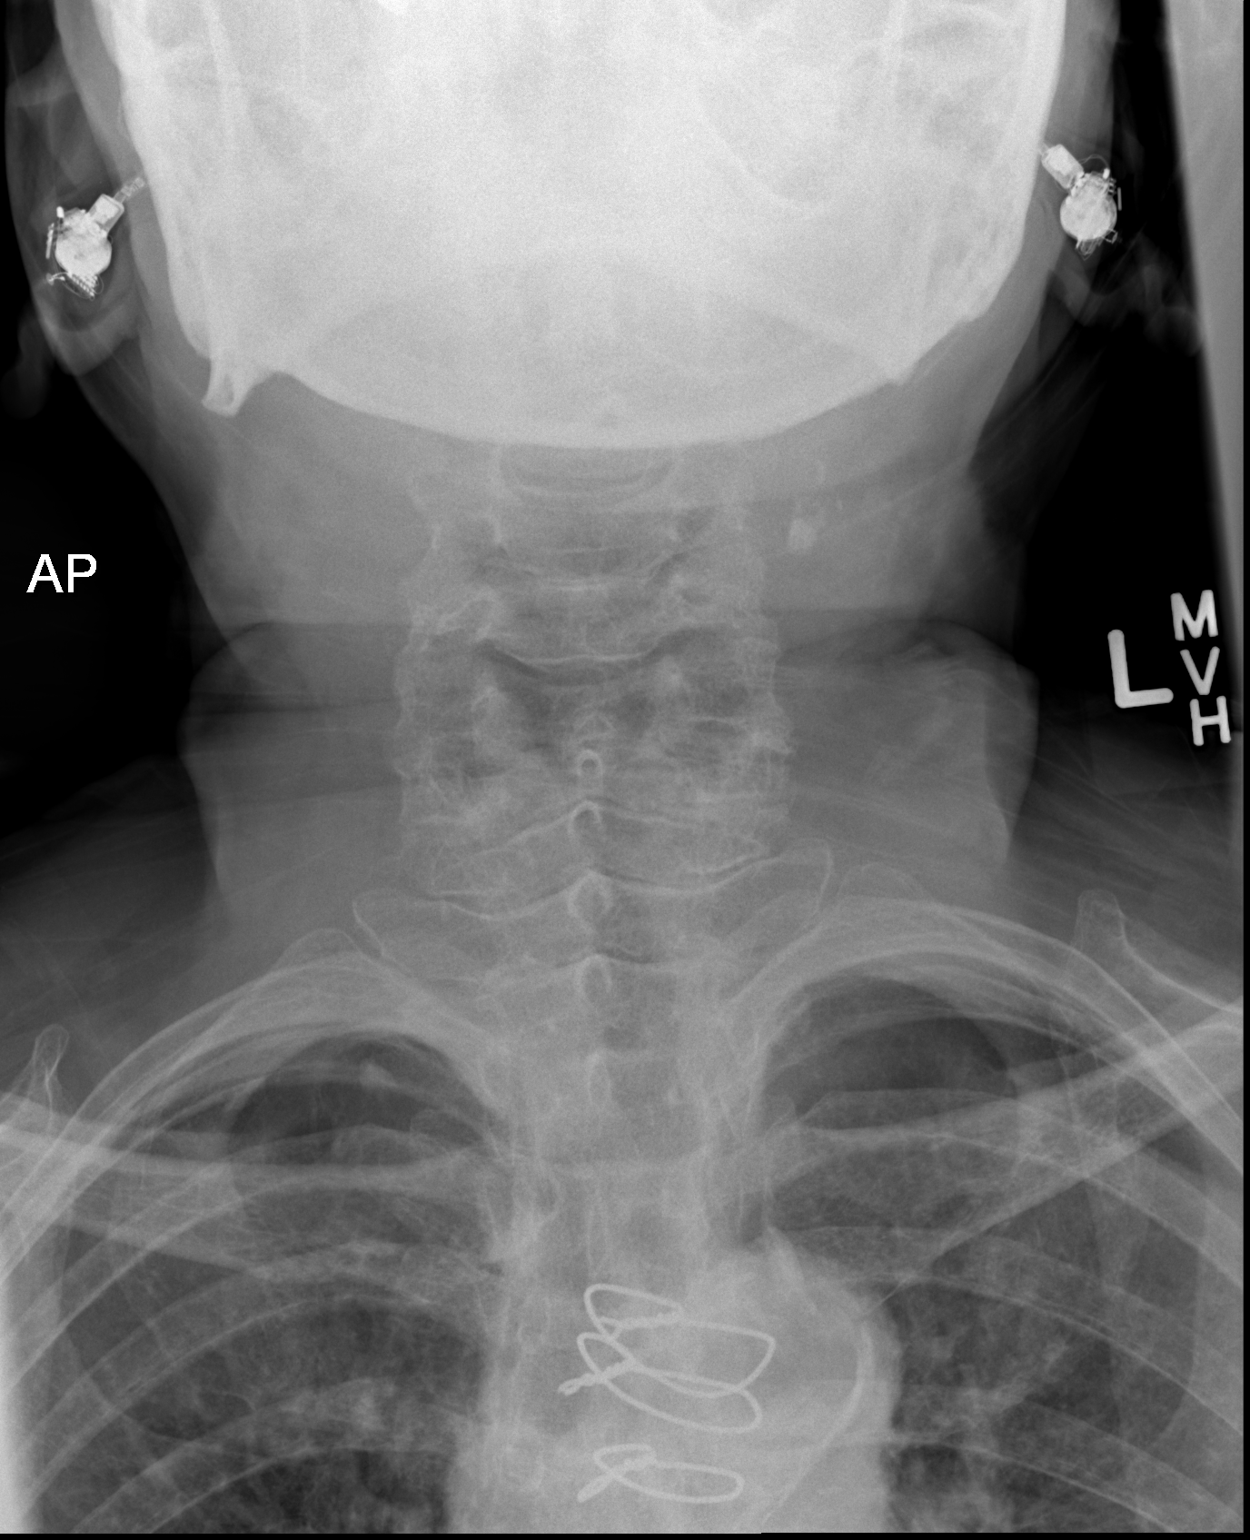

[2 of 2 positions shown; findings below may reference images not displayed]

FINDINGS: There is no evidence of retropharyngeal soft tissue swelling or
epiglottic enlargement. The cervical airway is unremarkable and no
radio-opaque foreign body identified. Electronic device is in the
bilateral external auditory canals are consistent with a hearing
aids. Atherosclerotic plaque is noted within the left common carotid
artery. Mild multilevel cervical spondylosis without focality. No
acute fracture identified. Incompletely imaged median sternotomy.
Atherosclerotic calcifications are noted in the transverse aorta.
The visualized lung apices appear clear.
IMPRESSION: No focal soft tissue abnormality detected by conventional
radiography.

Atherosclerosis including left carotid artery and aortic arch
disease.

## 2014-01-02 ENCOUNTER — Emergency Department (HOSPITAL_COMMUNITY): Payer: Medicare Other

## 2014-01-02 ENCOUNTER — Emergency Department (HOSPITAL_COMMUNITY)
Admission: EM | Admit: 2014-01-02 | Discharge: 2014-01-03 | Disposition: A | Discharge status: Home / Self Care | Payer: Medicare Other | Attending: Emergency Medicine | Admitting: Emergency Medicine

## 2014-01-02 ENCOUNTER — Encounter (HOSPITAL_COMMUNITY): Payer: Self-pay | Admitting: Emergency Medicine

## 2014-01-02 DIAGNOSIS — I2510 Atherosclerotic heart disease of native coronary artery without angina pectoris: Secondary | ICD-10-CM | POA: Insufficient documentation

## 2014-01-02 DIAGNOSIS — Z7982 Long term (current) use of aspirin: Secondary | ICD-10-CM | POA: Insufficient documentation

## 2014-01-02 DIAGNOSIS — Z87891 Personal history of nicotine dependence: Secondary | ICD-10-CM | POA: Insufficient documentation

## 2014-01-02 DIAGNOSIS — Z79899 Other long term (current) drug therapy: Secondary | ICD-10-CM | POA: Insufficient documentation

## 2014-01-02 DIAGNOSIS — R042 Hemoptysis: Secondary | ICD-10-CM | POA: Insufficient documentation

## 2014-01-02 DIAGNOSIS — J441 Chronic obstructive pulmonary disease with (acute) exacerbation: Secondary | ICD-10-CM | POA: Insufficient documentation

## 2014-01-02 DIAGNOSIS — I10 Essential (primary) hypertension: Secondary | ICD-10-CM | POA: Insufficient documentation

## 2014-01-02 DIAGNOSIS — R0602 Shortness of breath: Secondary | ICD-10-CM

## 2014-01-02 DIAGNOSIS — I509 Heart failure, unspecified: Secondary | ICD-10-CM | POA: Insufficient documentation

## 2014-01-02 DIAGNOSIS — Z853 Personal history of malignant neoplasm of breast: Secondary | ICD-10-CM | POA: Insufficient documentation

## 2014-01-02 DIAGNOSIS — Z8505 Personal history of malignant neoplasm of liver: Secondary | ICD-10-CM | POA: Insufficient documentation

## 2014-01-02 HISTORY — DX: Paralysis of vocal cords and larynx, unspecified: J38.00

## 2014-01-02 LAB — BASIC METABOLIC PANEL
Anion gap: 15 (ref 5–15)
BUN: 18 mg/dL (ref 6–23)
CHLORIDE: 93 meq/L — AB (ref 96–112)
CO2: 28 mEq/L (ref 19–32)
Calcium: 9.2 mg/dL (ref 8.4–10.5)
Creatinine, Ser: 1.06 mg/dL (ref 0.50–1.10)
GFR, EST AFRICAN AMERICAN: 52 mL/min — AB (ref >90)
GFR, EST NON AFRICAN AMERICAN: 45 mL/min — AB (ref >90)
Glucose, Bld: 127 mg/dL — ABNORMAL HIGH (ref 70–99)
POTASSIUM: 3.3 meq/L — AB (ref 3.7–5.3)
Sodium: 136 mEq/L — ABNORMAL LOW (ref 137–147)

## 2014-01-02 LAB — CBC
HCT: 34.4 % — ABNORMAL LOW (ref 36.0–46.0)
Hemoglobin: 11.5 g/dL — ABNORMAL LOW (ref 12.0–15.0)
MCH: 29.9 pg (ref 26.0–34.0)
MCHC: 33.4 g/dL (ref 30.0–36.0)
MCV: 89.6 fL (ref 78.0–100.0)
PLATELETS: 317 10*3/uL (ref 150–400)
RBC: 3.84 MIL/uL — AB (ref 3.87–5.11)
RDW: 13.2 % (ref 11.5–15.5)
WBC: 7.1 10*3/uL (ref 4.0–10.5)

## 2014-01-02 LAB — TROPONIN I: Troponin I: <0.3 ng/mL (ref <0.30)

## 2014-01-02 LAB — PRO B NATRIURETIC PEPTIDE: Pro B Natriuretic peptide (BNP): 320.4 pg/mL (ref 0–450)

## 2014-01-02 MED ORDER — IPRATROPIUM-ALBUTEROL 0.5-2.5 (3) MG/3ML IN SOLN
3.0000 mL | Freq: Once | RESPIRATORY_TRACT | Status: AC
  Administered 2014-01-02: 3 mL via RESPIRATORY_TRACT
  Filled 2014-01-02: qty 3

## 2014-01-02 MED ORDER — HYDROXYZINE HCL 10 MG PO TABS
10.0000 mg | ORAL_TABLET | Freq: Once | ORAL | Status: AC
  Administered 2014-01-02: 10 mg
  Filled 2014-01-02: qty 1

## 2014-01-02 MED ORDER — IOHEXOL 350 MG/ML SOLN
100.0000 mL | Freq: Once | INTRAVENOUS | Status: AC | PRN
  Administered 2014-01-02: 100 mL via INTRAVENOUS

## 2014-01-02 NOTE — ED Notes (Signed)
[facility] called for transportation to home

## 2014-01-02 NOTE — ED Notes (Signed)
Bed: WV14 Expected date:  Expected time:  Means of arrival:  Comments: EMS/trach./cancer/shob

## 2014-01-02 NOTE — ED Notes (Signed)
Pt returned from CT, trach mask secured on patient. No distress noted, family at bedside. VSS.

## 2014-01-02 NOTE — ED Notes (Signed)
MD at bedside. 

## 2014-01-02 NOTE — ED Notes (Signed)
Per EMS. Pt from home. Pt had tracheostomy placed a month ago for stage 4 lung ca. Pt has had irritation at trach site since placement with some bleeding. Pt has refused for family to suction trach for past week. Family reports pt is coughing up blood clots. EMS gave 5mg  albuterol prior to arrival. Respiratory at bedside suctioning trach. Pt deaf, can read lips.

## 2014-01-02 NOTE — Progress Notes (Signed)
Pt arrived with Shiley #6 cuffless trach.  Inner cannula was clean, suctioned for small amount of thick, pink tinged secreations. Placed pt on 28% trach collar for humidity.  Pt sats remain 100%.

## 2014-01-02 NOTE — ED Notes (Signed)
Patient transported to X-ray 

## 2014-01-02 NOTE — ED Provider Notes (Addendum)
CSN: [id number]     Arrival date & time 01/02/14  1728 History   First MD Initiated Contact with Patient 01/02/14 [place]     Chief Complaint  Patient presents with  . Shortness of Breath     (Consider location/radiation/quality/duration/timing/severity/associated sxs/prior Treatment) Patient is a 80 y.o. female presenting with shortness of breath. The history is provided by the patient.  Shortness of Breath Severity:  Mild Onset quality:  Gradual Timing:  Intermittent Progression:  Resolved Chronicity:  Recurrent Context comment:  Hemoptysis Relieved by: coughing up clot. Worsened by:  Nothing tried Ineffective treatments:  None tried Associated symptoms: no abdominal pain, no chest pain, no cough, no fever, no headaches, no neck pain and no vomiting     Past Medical History  Diagnosis Date  . Cancer     breast/lung/liver  . Cancer, metastatic   . COPD (chronic obstructive pulmonary disease)   . CHF (congestive heart failure)   . Complication of anesthesia     hard to wake up  . Hypertension   . Coronary artery disease   . Vocal cord paralysis    Past Surgical History  Procedure Laterality Date  . Thyroid surgery    . Cardiac surgery    . Abdominal surgery    . Breast surgery    . Small intestine surgery    . Tracheostomy     Family History  Problem Relation Age of Onset  . Hypertension Father   . Heart disease Father   . Hypertension Sister   . Hypertension Brother    History  Substance Use Topics  . Smoking status: Former Smoker -- 1.00 packs/day for 45 years    Types: Cigarettes    Quit date: 04/15/1969  . Smokeless tobacco: Never Used  . Alcohol Use: No   OB History   Grav Para Term Preterm Abortions TAB SAB Ect Mult Living                 Review of Systems  Constitutional: Negative for fever and fatigue.  HENT: Negative for congestion and drooling.   Eyes: Negative for pain.  Respiratory: Positive for shortness of breath. Negative for cough.         Hemoptysis  Cardiovascular: Negative for chest pain.  Gastrointestinal: Negative for nausea, vomiting, abdominal pain and diarrhea.  Genitourinary: Negative for dysuria and hematuria.  Musculoskeletal: Negative for back pain, gait problem and neck pain.  Skin: Negative for color change.  Neurological: Negative for dizziness and headaches.  Hematological: Negative for adenopathy.  Psychiatric/Behavioral: Negative for behavioral problems.  All other systems reviewed and are negative.     Allergies  Review of patient's allergies indicates no known allergies.  Home Medications   Prior to Admission medications   Medication Sig Start Date End Date Taking? Authorizing Provider  albuterol (PROVENTIL HFA;VENTOLIN HFA) 108 (90 BASE) MCG/ACT inhaler Inhale 2 puffs into the lungs every 4 (four) hours as needed for wheezing or shortness of breath.    Yes Historical Provider, MD  amLODipine (NORVASC) 10 MG tablet Take 10 mg by mouth daily.   Yes Historical Provider, MD  aspirin 325 MG EC tablet Take 325 mg by mouth daily.   Yes Historical Provider, MD  budesonide (PULMICORT) 0.5 MG/2ML nebulizer solution Take 0.5 mg by nebulization 2 (two) times daily.   Yes Historical Provider, MD  bumetanide (BUMEX) 1 MG tablet Take 1 mg by mouth daily.   Yes Historical Provider, MD  erythromycin (ERY-TAB) 250 MG EC tablet  Take 250 mg by mouth 3 (three) times daily.   Yes Historical Provider, MD  Eszopiclone 3 MG TABS Take 3 mg by mouth at bedtime. Take immediately before bedtime   Yes Historical Provider, MD  furosemide (LASIX) 40 MG tablet Take 40 mg by mouth daily.   Yes Historical Provider, MD  HYDROcodone-acetaminophen (NORCO/VICODIN) 5-325 MG per tablet Take 1 tablet by mouth every 6 (six) hours as needed for moderate pain.   Yes Historical Provider, MD  ipratropium-albuterol (DUONEB) 0.5-2.5 (3) MG/3ML SOLN Take 3 mLs by nebulization 4 (four) times daily. May use 2 additional treatments PRN for increased  symptoms. 09/06/13  Yes Kathee Delton, MD  letrozole [facility]) 2.5 MG tablet Take 2.5 mg by mouth daily.   Yes Historical Provider, MD  Morphine Sulfate (MORPHINE CONCENTRATE) 10 mg / 0.5 ml concentrated solution Take 10 mg by mouth every 2 (two) hours as needed for severe pain.   Yes Historical Provider, MD  pantoprazole (PROTONIX) 40 MG tablet Take 40 mg by mouth daily.   Yes Historical Provider, MD  potassium chloride 20 MEQ/15ML (10%) solution Take 20 mEq by mouth daily.   Yes Historical Provider, MD  protective barrier (RESTORE) CREA Apply 1 application topically daily. To buttocks/sore   Yes Historical Provider, MD  rosuvastatin (CRESTOR) 10 MG tablet Take 10 mg by mouth daily.   Yes Historical Provider, MD  senna (SENOKOT) 8.6 MG tablet Take 1 tablet by mouth every other day.   Yes Historical Provider, MD  zolpidem (AMBIEN) 5 MG tablet Take 5 mg by mouth at bedtime as needed for sleep.   Yes Historical Provider, MD   BP 129/59  Pulse 78  Temp(Src) 99 F (37.2 C) (Oral)  Resp 17  SpO2 100% Physical Exam  Nursing note and vitals reviewed. Constitutional: She is oriented to person, place, and time. She appears well-developed and well-nourished.  HENT:  Head: Normocephalic and atraumatic.  Mouth/Throat: Oropharynx is clear and moist. No oropharyngeal exudate.  Eyes: Conjunctivae and EOM are normal. Pupils are equal, round, and reactive to light.  Neck: Normal range of motion. Neck supple.  Cardiovascular: Normal rate, regular rhythm, normal heart sounds and intact distal pulses.  Exam reveals no gallop and no friction rub.   No murmur heard. Pulmonary/Chest: Effort normal and breath sounds normal. No respiratory distress. She has no wheezes.  Mildly rhonchorous bilaterally with faint expiratory wheeze.  Abdominal: Soft. Bowel sounds are normal. There is no tenderness. There is no rebound and no guarding.  G-tube on the abdomen appears clean dry and intact.  Musculoskeletal: Normal  range of motion. She exhibits no edema and no tenderness.  Stage 1-2 small sacral ulcer noted on the gluteal cleft.  Neurological: She is alert and oriented to person, place, and time.  Skin: Skin is warm and dry.  Psychiatric: She has a normal mood and affect. Her behavior is normal.    ED Course  Procedures (including critical care time) Labs Review Labs Reviewed  BASIC METABOLIC PANEL - Abnormal; Notable for the following:    Sodium 136 (*)    Potassium 3.3 (*)    Chloride 93 (*)    Glucose, Bld 127 (*)    GFR calc non Af Amer 45 (*)    GFR calc Af Amer 52 (*)    All other components within normal limits  CBC - Abnormal; Notable for the following:    RBC 3.84 (*)    Hemoglobin 11.5 (*)    HCT 34.4 (*)  All other components within normal limits  TROPONIN I  PRO B NATRIURETIC PEPTIDE    Imaging Review Dg Chest 2 View (if Patient Has Fever And/or Copd)  01/02/2014   CLINICAL DATA:  SHORTNESS OF BREATH  EXAM: CHEST  2 VIEW  COMPARISON:  Portal chest radiograph 08/18/2013  FINDINGS: Endotracheal tube tip at the level superior border the clavicles. Patient is status post median sternotomy and coronary artery bypass grafting. Cardiac silhouette within the upper limits of normal. Atherosclerotic calcifications in the aorta. Stable left breast implant. Lungs are clear. No acute osseus abnormalities.  IMPRESSION: No active cardiopulmonary disease.   Electronically Signed   By: Margaree Mackintosh M.D.   On: 01/02/2014 18:25     EKG Interpretation   Date/Time:  Sunday January 02 2014 18:05:00 EDT Ventricular Rate:  79 PR Interval:  221 QRS Duration: 80 QT Interval:  388 QTC Calculation: 445 R Axis:   -24 Text Interpretation:  Sinus rhythm Prolonged PR interval Borderline left  axis deviation Consider anterior infarct No significant change since last  tracing Confirmed by [facility]  MD, MARTHA [phone]) on 01/02/2014 11:21:54 PM      MDM   Final diagnoses:  SOB (shortness of breath)   Hemoptysis    7:36 PM 80 y.o. female w history of lung cancer with liver metastases, coronary disease, CHF, COPD, vocal cord paralysis status post recent tracheostomy approximately one month ago who presents with shortness of breath. The family states that the patient was recently hospitalized in [place]. The daughter reports there she had a tracheostomy placed and was found to have some fluid and infection in her lungs. She was living on her own and when she was discharged she decided to come back to [facility] to stay with her family. She is with her family for the last 4 days. The family states that she has been inconsistent with her breathing treatments and utilization of humidified air. They note that she coughed up a dark red blood clot yesterday and another one today. The patient states that she felt short of breath earlier but denies this now. She denies any chest pain. She is afebrile and tachypneic here. Vital signs are otherwise unremarkable. Will get labs and imaging.  Her shortness of breath seems to be brief and usually associated with coughing up the clots per the family member. The patient is typically much better after she coughs up the clot.  10:07 PM: I had RT evaluate her multiple times and suction her trachea. Nothing but clear thin sputum. The pt also continues to cough of thin clear sputum. She remains tachypneic w/ occasional cough exacerbations but is not hypoxic and is otherwise well appearing. She does not appear grossly unchanged since her arrival per family. I'm not sure she would benefit from admission. I discussed this w/ the family and they agree. Did recommend pt be more diligent about using her humidified air and breathing tx's at home as she has been mostly non-compliant. Welcomed her return for any worsening. CTA was neg for PE and labs otherwise non-contrib.  I have discussed the diagnosis/risks/treatment options with the patient/family and believe the pt to  be eligible for discharge home to follow-up with her pcp/oncologist next week. We also discussed returning to the ED immediately if new or worsening sx occur. We discussed the sx which are most concerning (e.g., sob, cp, fever) that necessitate immediate return. Medications administered to the patient during their visit and any new prescriptions provided to the patient  are listed below.  Medications given during this visit Medications  hydrOXYzine (ATARAX/VISTARIL) tablet 10 mg (not administered)  ipratropium-albuterol (DUONEB) 0.5-2.5 (3) MG/3ML nebulizer solution 3 mL (3 mLs Nebulization Given 01/02/14 1920)  iohexol (OMNIPAQUE) 350 MG/ML injection 100 mL (100 mLs Intravenous Contrast Given 01/02/14 1934)    Discharge Medication List as of 01/02/2014 10:04 PM       Shea Evans Ruthe Mannan, MD 01/03/14 2001  Blanchard Kelch, MD 01/03/14 2002

## 2014-01-03 NOTE — ED Notes (Signed)
Per dispatch pt is net in que to be transported

## 2014-01-03 NOTE — ED Notes (Signed)
Pt transported from ED to home via [place]. D/C given to family member at bedside

## 2014-01-07 ENCOUNTER — Other Ambulatory Visit (HOSPITAL_COMMUNITY): Payer: Self-pay | Admitting: Internal Medicine

## 2014-01-07 DIAGNOSIS — R1310 Dysphagia, unspecified: Secondary | ICD-10-CM

## 2014-01-09 ENCOUNTER — Emergency Department (HOSPITAL_COMMUNITY)
Admission: EM | Admit: 2014-01-09 | Discharge: 2014-01-09 | Disposition: A | Discharge status: Home / Self Care | Payer: Medicare Other | Attending: Emergency Medicine | Admitting: Emergency Medicine

## 2014-01-09 ENCOUNTER — Encounter (HOSPITAL_COMMUNITY): Payer: Self-pay | Admitting: Emergency Medicine

## 2014-01-09 DIAGNOSIS — Z7982 Long term (current) use of aspirin: Secondary | ICD-10-CM | POA: Diagnosis not present

## 2014-01-09 DIAGNOSIS — J9503 Malfunction of tracheostomy stoma: Secondary | ICD-10-CM | POA: Insufficient documentation

## 2014-01-09 DIAGNOSIS — Z85118 Personal history of other malignant neoplasm of bronchus and lung: Secondary | ICD-10-CM | POA: Insufficient documentation

## 2014-01-09 DIAGNOSIS — I10 Essential (primary) hypertension: Secondary | ICD-10-CM | POA: Diagnosis not present

## 2014-01-09 DIAGNOSIS — Z8505 Personal history of malignant neoplasm of liver: Secondary | ICD-10-CM | POA: Insufficient documentation

## 2014-01-09 DIAGNOSIS — Z87891 Personal history of nicotine dependence: Secondary | ICD-10-CM | POA: Insufficient documentation

## 2014-01-09 DIAGNOSIS — J449 Chronic obstructive pulmonary disease, unspecified: Secondary | ICD-10-CM | POA: Diagnosis not present

## 2014-01-09 DIAGNOSIS — Z9889 Other specified postprocedural states: Secondary | ICD-10-CM | POA: Insufficient documentation

## 2014-01-09 DIAGNOSIS — Z79899 Other long term (current) drug therapy: Secondary | ICD-10-CM | POA: Diagnosis not present

## 2014-01-09 DIAGNOSIS — J398 Other specified diseases of upper respiratory tract: Secondary | ICD-10-CM | POA: Diagnosis present

## 2014-01-09 DIAGNOSIS — J4489 Other specified chronic obstructive pulmonary disease: Secondary | ICD-10-CM | POA: Insufficient documentation

## 2014-01-09 DIAGNOSIS — I2510 Atherosclerotic heart disease of native coronary artery without angina pectoris: Secondary | ICD-10-CM | POA: Insufficient documentation

## 2014-01-09 DIAGNOSIS — J988 Other specified respiratory disorders: Secondary | ICD-10-CM | POA: Diagnosis present

## 2014-01-09 DIAGNOSIS — Z792 Long term (current) use of antibiotics: Secondary | ICD-10-CM | POA: Insufficient documentation

## 2014-01-09 DIAGNOSIS — J9509 Other tracheostomy complication: Secondary | ICD-10-CM

## 2014-01-09 DIAGNOSIS — Z853 Personal history of malignant neoplasm of breast: Secondary | ICD-10-CM | POA: Insufficient documentation

## 2014-01-09 MED ORDER — IPRATROPIUM-ALBUTEROL 0.5-2.5 (3) MG/3ML IN SOLN
3.0000 mL | Freq: Once | RESPIRATORY_TRACT | Status: AC
  Administered 2014-01-09: 3 mL via RESPIRATORY_TRACT
  Filled 2014-01-09: qty 3

## 2014-01-09 NOTE — Progress Notes (Signed)
RT called by RN to pt bedside for assistance with trach pt.  Pt noted with 6cfs shiley trach without inner cannula.  RT unable to pass suction catheter.  RT changed trach out without difficulty to same size and type.  ETCO2 color change noted as well as equal breath sounds.  Pt tolerated procedure well.  Thick hardened mucus plug found at end of the trach that was removed.  Pt was hyperoxygenated on 100% fio2 prior to trach change and placed on 28% atc afterwards.  AJ68, rr16, sats100%.  BBS diminished, but clear.  Pt suctioned for small amount of thick tan/brown secretions.

## 2014-01-09 NOTE — ED Notes (Signed)
Bed: IZ12 Expected date:  Expected time:  Means of arrival:  Comments: EMS/trach problem

## 2014-01-09 NOTE — Discharge Instructions (Signed)
The tracheostomy tube today had a large mucus plug causing her breathing difficulties.  Her tube was changed out.  Use humidifiers in her living areas to keep air moist.

## 2014-01-09 NOTE — ED Provider Notes (Signed)
CSN: [id number]     Arrival date & time 01/09/14  0302 History   First MD Initiated Contact with Patient 01/09/14 0315     Chief Complaint  Patient presents with  . trach problem      (Consider location/radiation/quality/duration/timing/severity/associated sxs/prior Treatment) HPI 80 year old female presents to emergency room via EMS from home with complaint of difficulty breathing.  Patient with trach placed roughly a month ago for vocal cord paralysis.  Daughter has been unable to suction this evening, reports difficulties with suctioning over the last 24-48 hours.  Also having difficulties putting in the inner cannula.  Patient received albuterol in route.  Patient reports she does not feel well. Past Medical History  Diagnosis Date  . Cancer     breast/lung/liver  . Cancer, metastatic   . COPD (chronic obstructive pulmonary disease)   . CHF (congestive heart failure)   . Complication of anesthesia     hard to wake up  . Hypertension   . Coronary artery disease   . Vocal cord paralysis    Past Surgical History  Procedure Laterality Date  . Thyroid surgery    . Cardiac surgery    . Abdominal surgery    . Breast surgery    . Small intestine surgery    . Tracheostomy     Family History  Problem Relation Age of Onset  . Hypertension Father   . Heart disease Father   . Hypertension Sister   . Hypertension Brother    History  Substance Use Topics  . Smoking status: Former Smoker -- 1.00 packs/day for 45 years    Types: Cigarettes    Quit date: 04/15/1969  . Smokeless tobacco: Never Used  . Alcohol Use: No   OB History   Grav Para Term Preterm Abortions TAB SAB Ect Mult Living                 Review of Systems  Unable to perform ROS: Acuity of condition      Allergies  Review of patient's allergies indicates no known allergies.  Home Medications   Prior to Admission medications   Medication Sig Start Date End Date Taking? Authorizing Provider  albuterol  (PROVENTIL HFA;VENTOLIN HFA) 108 (90 BASE) MCG/ACT inhaler Inhale 2 puffs into the lungs every 4 (four) hours as needed for wheezing or shortness of breath.     Historical Provider, MD  amLODipine (NORVASC) 10 MG tablet Take 10 mg by mouth daily.    Historical Provider, MD  aspirin 325 MG EC tablet Take 325 mg by mouth daily.    Historical Provider, MD  budesonide (PULMICORT) 0.5 MG/2ML nebulizer solution Take 0.5 mg by nebulization 2 (two) times daily.    Historical Provider, MD  bumetanide (BUMEX) 1 MG tablet Take 1 mg by mouth daily.    Historical Provider, MD  erythromycin (ERY-TAB) 250 MG EC tablet Take 250 mg by mouth 3 (three) times daily.    Historical Provider, MD  Eszopiclone 3 MG TABS Take 3 mg by mouth at bedtime. Take immediately before bedtime    Historical Provider, MD  furosemide (LASIX) 40 MG tablet Take 40 mg by mouth daily.    Historical Provider, MD  HYDROcodone-acetaminophen (NORCO/VICODIN) 5-325 MG per tablet Take 1 tablet by mouth every 6 (six) hours as needed for moderate pain.    Historical Provider, MD  ipratropium-albuterol (DUONEB) 0.5-2.5 (3) MG/3ML SOLN Take 3 mLs by nebulization 4 (four) times daily. May use 2 additional treatments PRN for increased  symptoms. 09/06/13   Kathee Delton, MD  letrozole [facility]) 2.5 MG tablet Take 2.5 mg by mouth daily.    Historical Provider, MD  Morphine Sulfate (MORPHINE CONCENTRATE) 10 mg / 0.5 ml concentrated solution Take 10 mg by mouth every 2 (two) hours as needed for severe pain.    Historical Provider, MD  pantoprazole (PROTONIX) 40 MG tablet Take 40 mg by mouth daily.    Historical Provider, MD  potassium chloride 20 MEQ/15ML (10%) solution Take 20 mEq by mouth daily.    Historical Provider, MD  protective barrier (RESTORE) CREA Apply 1 application topically daily. To buttocks/sore    Historical Provider, MD  rosuvastatin (CRESTOR) 10 MG tablet Take 10 mg by mouth daily.    Historical Provider, MD  senna (SENOKOT) 8.6 MG tablet  Take 1 tablet by mouth every other day.    Historical Provider, MD  zolpidem (AMBIEN) 5 MG tablet Take 5 mg by mouth at bedtime as needed for sleep.    Historical Provider, MD   BP 130/76  Pulse 73  Temp(Src) 98.2 F (36.8 C) (Oral)  Resp 18  SpO2 93% Physical Exam  Nursing note and vitals reviewed. Constitutional: She is oriented to person, place, and time.  Frail, chronically ill-appearing  HENT:  Head: Normocephalic and atraumatic.  Nose: Nose normal.  Mouth/Throat: Oropharynx is clear and moist.  Neck:  Trach in place without signs of redness inflammation or drainage  Cardiovascular: Normal rate, regular rhythm, normal heart sounds and intact distal pulses.  Exam reveals no gallop and no friction rub.   No murmur heard. Pulmonary/Chest: Effort normal. She has wheezes. She has rales. She exhibits no tenderness.  Inspiratory and expiratory wheezing with coarse breath sounds bilaterally  Abdominal: Soft. Bowel sounds are normal. She exhibits no distension and no mass. There is no tenderness. There is no rebound and no guarding.  Musculoskeletal: Normal range of motion. She exhibits no edema and no tenderness.  Neurological: She is alert and oriented to person, place, and time.  Skin: Skin is warm and dry. No rash noted. No erythema. No pallor.    ED Course  Procedures (including critical care time) Labs Review Labs Reviewed - No data to display  Imaging Review No results found.   EKG Interpretation None      MDM   Final diagnoses:  Tracheostomy obstruction    80 year old female with trach difficulties.  RT unable to pass suction.  Tube changed out, patient had large obstructing mucous plug.  Patient doing much better after second neb and trach replaced.    Kalman Drape, MD 01/09/14 [phone]

## 2014-01-09 NOTE — ED Notes (Addendum)
Per EMS: pt presents with possible obstructed trach. Daughter states she has been having difficulties with putting inner cannula in for the past two day and she took it out at 0100 and couldn't get it back in. Also can't pass suction tube down. 5 mg albuterol given in route.

## 2014-01-10 ENCOUNTER — Encounter: Payer: Self-pay | Admitting: Pulmonary Disease

## 2014-01-10 ENCOUNTER — Ambulatory Visit (INDEPENDENT_AMBULATORY_CARE_PROVIDER_SITE_OTHER): Payer: Medicare Other | Admitting: Pulmonary Disease

## 2014-01-10 VITALS — BP 140/80 | HR 101 | Temp 98.0°F | Ht 63.0 in | Wt 111.0 lb

## 2014-01-10 DIAGNOSIS — J439 Emphysema, unspecified: Secondary | ICD-10-CM

## 2014-01-10 DIAGNOSIS — J438 Other emphysema: Secondary | ICD-10-CM

## 2014-01-10 NOTE — Patient Instructions (Signed)
You need to take your neb treatments four times a day, and add budesonide twice a day Will get you a trach collar setup to deliver your nebulizer medication followup with me again in 32mos.

## 2014-01-10 NOTE — Progress Notes (Signed)
   Subjective:    Patient ID: Danielle Bullock, female    DOB: 11-30-1921, 80 y.o.   MRN: [id number]  HPI The patient comes in today for followup of her known COPD. She also has a chronic tracheostomy because of chronic vocal cord issues with airway obstruction. I have followed her in the past for her COPD, but she moved to [place] to establish residence. She has now come back, and needs to reestablish for her pulmonary care. Her breathing is unchanged from her last visit with me, but she is not taking her nebulizer treatments on a regular basis. She tells me that it is too difficult with her breathing to take the treatments, but she is not doing them through her trach collar. He denies any recent acute exacerbation or pulmonary infection. She has reestablished with otolaryngology for her trach care, and her family member tells me that her checkup showed no significant issues with the tracheostomy.   Review of Systems  Constitutional: Negative for fever and unexpected weight change.  HENT: Negative for congestion, dental problem, ear pain, nosebleeds, postnasal drip, rhinorrhea, sinus pressure, sneezing, sore throat and trouble swallowing.   Eyes: Negative for redness and itching.  Respiratory: Positive for cough and chest tightness. Negative for shortness of breath and wheezing.   Cardiovascular: Negative for palpitations and leg swelling.  Gastrointestinal: Negative for nausea and vomiting.  Genitourinary: Negative for dysuria.  Musculoskeletal: Negative for joint swelling.  Skin: Negative for rash.  Neurological: Negative for headaches.  Hematological: Does not bruise/bleed easily.  Psychiatric/Behavioral: Negative for dysphoric mood. The patient is not nervous/anxious.        Objective:   Physical Exam Frail appearing female in nad Nose without purulence or d/c noted. Trach site clean, appears patent Chest with clear bs, no wheezing or crackles Cor with rrr Minimal LE edema, no  cyanosis Alert and oriented, moves all 4.        Assessment & Plan:

## 2014-01-10 NOTE — Assessment & Plan Note (Signed)
The patient has moderate COPD, but feels that she is at her usual baseline with respect to her breathing. She is not taking her bronchodilators on a regular basis, and I will see if we can get her a trach collar set up to be able to deliver her nebulized meds in that fashion. She is willing to take her treatments 4 times a day if we can do this.

## 2014-01-11 ENCOUNTER — Ambulatory Visit (HOSPITAL_COMMUNITY): Admission: RE | Admit: 2014-01-11 | Payer: Medicare Other | Source: Ambulatory Visit

## 2014-01-11 ENCOUNTER — Other Ambulatory Visit (HOSPITAL_COMMUNITY): Payer: Self-pay

## 2014-01-14 ENCOUNTER — Ambulatory Visit (HOSPITAL_COMMUNITY)
Admission: RE | Admit: 2014-01-14 | Discharge: 2014-01-14 | Disposition: A | Discharge status: Home / Self Care | Payer: Medicare Other | Source: Ambulatory Visit | Attending: Internal Medicine | Admitting: Internal Medicine

## 2014-01-14 ENCOUNTER — Ambulatory Visit (HOSPITAL_COMMUNITY)
Admission: RE | Admit: 2014-01-14 | Discharge: 2014-01-14 | Disposition: A | Discharge status: Home / Self Care | Payer: No Typology Code available for payment source | Source: Ambulatory Visit | Attending: Internal Medicine | Admitting: Internal Medicine

## 2014-01-14 DIAGNOSIS — C3490 Malignant neoplasm of unspecified part of unspecified bronchus or lung: Secondary | ICD-10-CM | POA: Diagnosis not present

## 2014-01-14 DIAGNOSIS — I2510 Atherosclerotic heart disease of native coronary artery without angina pectoris: Secondary | ICD-10-CM | POA: Insufficient documentation

## 2014-01-14 DIAGNOSIS — I10 Essential (primary) hypertension: Secondary | ICD-10-CM | POA: Diagnosis not present

## 2014-01-14 DIAGNOSIS — J449 Chronic obstructive pulmonary disease, unspecified: Secondary | ICD-10-CM | POA: Insufficient documentation

## 2014-01-14 DIAGNOSIS — C50919 Malignant neoplasm of unspecified site of unspecified female breast: Secondary | ICD-10-CM | POA: Diagnosis not present

## 2014-01-14 DIAGNOSIS — I509 Heart failure, unspecified: Secondary | ICD-10-CM | POA: Diagnosis not present

## 2014-01-14 DIAGNOSIS — R1311 Dysphagia, oral phase: Secondary | ICD-10-CM | POA: Insufficient documentation

## 2014-01-14 DIAGNOSIS — R1313 Dysphagia, pharyngeal phase: Secondary | ICD-10-CM | POA: Insufficient documentation

## 2014-01-14 DIAGNOSIS — R1310 Dysphagia, unspecified: Secondary | ICD-10-CM | POA: Diagnosis present

## 2014-01-14 DIAGNOSIS — J3800 Paralysis of vocal cords and larynx, unspecified: Secondary | ICD-10-CM | POA: Diagnosis not present

## 2014-01-14 DIAGNOSIS — R1319 Other dysphagia: Secondary | ICD-10-CM | POA: Diagnosis not present

## 2014-01-14 DIAGNOSIS — Z930 Tracheostomy status: Secondary | ICD-10-CM | POA: Insufficient documentation

## 2014-01-14 DIAGNOSIS — J4489 Other specified chronic obstructive pulmonary disease: Secondary | ICD-10-CM | POA: Insufficient documentation

## 2014-01-14 NOTE — Procedures (Signed)
Objective Swallowing Evaluation: Modified Barium Swallowing Study  Patient Details  Name: Danielle Bullock MRN: [id number] Date of Birth: August 19, 1921  Today's Date: 01/14/2014 Time: 4235-3614 SLP Time Calculation (min): 70 min  Past Medical History:  Past Medical History  Diagnosis Date  . Cancer     breast/lung/liver  . Cancer, metastatic   . COPD (chronic obstructive pulmonary disease)   . CHF (congestive heart failure)   . Complication of anesthesia     hard to wake up  . Hypertension   . Coronary artery disease   . Vocal cord paralysis    Past Surgical History:  Past Surgical History  Procedure Laterality Date  . Thyroid surgery    . Cardiac surgery    . Abdominal surgery    . Breast surgery    . Small intestine surgery    . Tracheostomy     HPI:  80 yo female referred for outpt MBS - per daughter to determine pt ability to consume liquids.  Pt has a long term feeding tube and a trach placed 12/02/2013, h/o vocal cord paralysis, thyroid surgery, HTN, CAD, COPD, metastatic breast/lung cancer.  She resides with her daughter and was a previous smoker - stopped in 40.  Per daughter pt occasionally consumes applesauce, pudding, yogurt, vienna sausages.  She has been seen in the ED twice in the last two weeks due to concern for mucus plugging.  Pt uses humidification at home and consumes ice to assist with mucus management.       Assessment / Plan / Recommendation Clinical Impression  Dysphagia Diagnosis: Moderate oral phase dysphagia;Severe pharyngeal phase dysphagia;Severe cervical esophageal phase dysphagia (suspect significant esophageal component)   Clinical impression: Pt presents with suspected multifactorial dysphagia involving oropharynx and esophagus with significant fatigue factor.  Of note when pt conducted dry swallow prior to administration of barium, this resulted in overt coughing with frequent expectoration orally of clear secretions that appeared frothy.  SLP  suspects pt aspirated secretions retained in pharynx with dry swallow.  Pt required daughter to suction her tracheally and needed several minutes to recover as this produced dyspnea with increased RR.   Pt fatigued very quickly during MBS with increased RR requiring multiple rest breaks.  When pt was able to proceed, she accepted very small boluses with quick oral transiting and delayed pharyngeal swallow (to vallecular space for several seconds) prior to swallow initiation.  She had post swallow tongue base and pharyngeal residuals with poor ability to clear with cued cough/hocking or swallows.    UES opening and esophageal clearance also appeared very compromised with secretions mixed with barium retained without pt adequate sensation.  Mild amount of aspiration noted with liquids from residuals in pyriform sinus spilling into open larynx.  SLP requested daughter suction pt again, she suctioned orally into pharynx with small bore catheter which successfully removed 90% of barium residuals pt did not clear.    SLP is concerned that pt is aspirating secretions at this point without adequate awareness nor ability to clear.  In light of recent ED visits for secretion/possible mucus plugging, suspect this is negatively impacting swallow along with GROSS weakness.  Daughter reports pt requires frequent oral and tracheal suctioning throughout the day.    Recommend pt continue npo except ice chips for oral hygeine/comfort.  Defer to referring SLP for indication for testing in future as pt has a PEG for nutrition.  Using visual and verbal feedback, educated pt/daughter to findings and recommendations.     Treatment  Recommendation  Defer treatment plan to SLP at (Comment) (Home health SLP)    Diet Recommendation NPO;Ice chips PRN after oral care   Liquid Administration via: Spoon Medication Administration: Via alternative means Compensations: Multiple dry swallows after each bite/sip Postural Changes and/or  Swallow Maneuvers: Upright 30-60 min after meal;Seated upright 90 degrees       Follow Up Recommendations  Home health SLP      General Date of Onset: 01/14/14 HPI: 80 yo female referred for outpt MBS - per daughter to determine pt ability to consume liquids.  Pt has a long term feeding tube and a trach placed 12/02/2013, h/o vocal cord paralysis, thyroid surgery, HTN, CAD, COPD, metastatic breast/lung cancer.  She resides with her daughter and was a previous smoker - stopped in 2.  Per daughter pt occasionally consumes applesauce, pudding, yogurt, vienna sausages.  She has been seen in the ED twice in the last two weeks due to concern for mucus plugging.  Pt uses humidification at home and consumes ice to assist with mucus management.   Type of Study: Modified Barium Swallowing Study Reason for Referral: Objectively evaluate swallowing function Diet Prior to this Study: Other (Comment) (per daughter consumes soft foods/ no liquids) Temperature Spikes Noted: No Respiratory Status: Room air History of Recent Intubation: No Behavior/Cognition: Alert;Cooperative;Pleasant mood Oral Cavity - Dentition:  (has lower dentition, upper dentures at home that she intermittently uses) Oral Motor / Sensory Function: Impaired motor (generalized weakness, pt orally suctioning secretions) Self-Feeding Abilities: Able to feed self Patient Positioning: Upright in chair Baseline Vocal Quality: Hoarse;Low vocal intensity (with finger occulsion of trach as pt does not have a PMSV) Volitional Cough: Weak Volitional Swallow: Able to elicit Pharyngeal Secretions: Standing secretions in (comment) (pharynx mix with barium)    Reason for Referral Objectively evaluate swallowing function   Oral Phase Oral Preparation/Oral Phase Oral Phase: Impaired Oral - Nectar Oral - Nectar Teaspoon: Weak lingual manipulation;Reduced posterior propulsion Oral - Thin Oral - Thin Teaspoon: Weak lingual manipulation;Reduced  posterior propulsion Oral - Thin Cup: Weak lingual manipulation;Reduced posterior propulsion Oral - Solids Oral - Puree: Weak lingual manipulation;Reduced posterior propulsion Oral Phase - Comment Oral Phase - Comment: pt takes very small boluses only, weak manipulation with rapid oral transiting   Pharyngeal Phase Pharyngeal Phase Pharyngeal Phase: Impaired Pharyngeal - Nectar Pharyngeal - Nectar Teaspoon: Delayed swallow initiation;Premature spillage to valleculae;Reduced laryngeal elevation;Pharyngeal residue - pyriform sinuses;Reduced pharyngeal peristalsis;Reduced tongue base retraction Pharyngeal - Thin Pharyngeal - Thin Teaspoon: Delayed swallow initiation;Premature spillage to valleculae;Reduced laryngeal elevation;Pharyngeal residue - pyriform sinuses;Reduced epiglottic inversion;Reduced pharyngeal peristalsis;Reduced tongue base retraction Pharyngeal - Thin Cup: Delayed swallow initiation;Premature spillage to valleculae;Reduced laryngeal elevation;Pharyngeal residue - pyriform sinuses;Penetration/Aspiration after swallow;Reduced pharyngeal peristalsis;Trace aspiration;Compensatory strategies attempted (Comment) Penetration/Aspiration details (thin cup): Material enters airway, passes BELOW cords without attempt by patient to eject out (silent aspiration) Pharyngeal - Solids Pharyngeal - Puree: Delayed swallow initiation;Premature spillage to valleculae;Reduced laryngeal elevation;Pharyngeal residue - valleculae;Reduced pharyngeal peristalsis;Reduced tongue base retraction Pharyngeal Phase - Comment Pharyngeal Comment: dry swallow resulted in pt overtly coughing/choking and requiring oral/tracheal suctioning by daughter  Cervical Esophageal Phase    GO    Cervical Esophageal Phase Cervical Esophageal Phase: Impaired Cervical Esophageal Phase - Nectar Nectar Teaspoon: Reduced cricopharyngeal relaxation Cervical Esophageal Phase - Thin Thin Teaspoon: Reduced cricopharyngeal  relaxation Thin Cup: Reduced cricopharyngeal relaxation Cervical Esophageal Phase - Solids Puree: Reduced cricopharyngeal relaxation Cervical Esophageal Phase - Comment Cervical Esophageal Comment: Appearance of poor clearance of esophagus without pt  sensation with minimal intake *few tsps of liquids only mixing with secretions.  Esophagus appears to taper distally - radiologist not present to confirm finding.   SLP suspects this contributes largely to pt's dysphagia/aspiration risk.  At end of study - esophagus appeared largely clear    Functional Assessment Tool Used: mbs, clinical impression Functional Limitations: Swallowing Swallow Current Status (C4888): At least 80 percent but less than 100 percent impaired, limited or restricted Swallow Goal Status [phone]): At least 80 percent but less than 100 percent impaired, limited or restricted Swallow Discharge Status [phone]): At least 80 percent but less than 100 percent impaired, limited or restricted    Claudie Fisherman, [facility] SLP [phone]

## 2014-01-31 ENCOUNTER — Emergency Department (HOSPITAL_COMMUNITY)
Admission: EM | Admit: 2014-01-31 | Discharge: 2014-02-01 | Disposition: A | Discharge status: Home / Self Care | Attending: Emergency Medicine | Admitting: Emergency Medicine

## 2014-01-31 ENCOUNTER — Emergency Department (HOSPITAL_COMMUNITY)

## 2014-01-31 ENCOUNTER — Ambulatory Visit: Payer: Medicare Other | Admitting: Pulmonary Disease

## 2014-01-31 DIAGNOSIS — Z7982 Long term (current) use of aspirin: Secondary | ICD-10-CM | POA: Insufficient documentation

## 2014-01-31 DIAGNOSIS — Z853 Personal history of malignant neoplasm of breast: Secondary | ICD-10-CM | POA: Diagnosis not present

## 2014-01-31 DIAGNOSIS — J441 Chronic obstructive pulmonary disease with (acute) exacerbation: Secondary | ICD-10-CM | POA: Insufficient documentation

## 2014-01-31 DIAGNOSIS — N390 Urinary tract infection, site not specified: Secondary | ICD-10-CM | POA: Insufficient documentation

## 2014-01-31 DIAGNOSIS — R079 Chest pain, unspecified: Secondary | ICD-10-CM | POA: Diagnosis not present

## 2014-01-31 DIAGNOSIS — Z79899 Other long term (current) drug therapy: Secondary | ICD-10-CM | POA: Diagnosis not present

## 2014-01-31 DIAGNOSIS — Z8505 Personal history of malignant neoplasm of liver: Secondary | ICD-10-CM | POA: Diagnosis not present

## 2014-01-31 DIAGNOSIS — Z87891 Personal history of nicotine dependence: Secondary | ICD-10-CM | POA: Insufficient documentation

## 2014-01-31 DIAGNOSIS — Z85118 Personal history of other malignant neoplasm of bronchus and lung: Secondary | ICD-10-CM | POA: Diagnosis not present

## 2014-01-31 DIAGNOSIS — R0602 Shortness of breath: Secondary | ICD-10-CM | POA: Insufficient documentation

## 2014-01-31 DIAGNOSIS — I10 Essential (primary) hypertension: Secondary | ICD-10-CM | POA: Insufficient documentation

## 2014-01-31 DIAGNOSIS — I509 Heart failure, unspecified: Secondary | ICD-10-CM | POA: Insufficient documentation

## 2014-01-31 DIAGNOSIS — I2510 Atherosclerotic heart disease of native coronary artery without angina pectoris: Secondary | ICD-10-CM | POA: Diagnosis not present

## 2014-01-31 LAB — CBC WITH DIFFERENTIAL/PLATELET
BASOS ABS: 0 10*3/uL (ref 0.0–0.1)
Basophils Relative: 0 % (ref 0–1)
EOS ABS: 0.1 10*3/uL (ref 0.0–0.7)
Eosinophils Relative: 2 % (ref 0–5)
HEMATOCRIT: 40.1 % (ref 36.0–46.0)
Hemoglobin: 12.9 g/dL (ref 12.0–15.0)
Lymphocytes Relative: 31 % (ref 12–46)
Lymphs Abs: 2.3 10*3/uL (ref 0.7–4.0)
MCH: 29.8 pg (ref 26.0–34.0)
MCHC: 32.2 g/dL (ref 30.0–36.0)
MCV: 92.6 fL (ref 78.0–100.0)
MONO ABS: 0.8 10*3/uL (ref 0.1–1.0)
Monocytes Relative: 11 % (ref 3–12)
Neutro Abs: 4.1 10*3/uL (ref 1.7–7.7)
Neutrophils Relative %: 56 % (ref 43–77)
PLATELETS: 297 10*3/uL (ref 150–400)
RBC: 4.33 MIL/uL (ref 3.87–5.11)
RDW: 13.8 % (ref 11.5–15.5)
WBC: 7.3 10*3/uL (ref 4.0–10.5)

## 2014-01-31 LAB — BASIC METABOLIC PANEL
ANION GAP: 13 (ref 5–15)
BUN: 24 mg/dL — ABNORMAL HIGH (ref 6–23)
CALCIUM: 10 mg/dL (ref 8.4–10.5)
CO2: 32 meq/L (ref 19–32)
Chloride: 93 mEq/L — ABNORMAL LOW (ref 96–112)
Creatinine, Ser: 0.87 mg/dL (ref 0.50–1.10)
GFR calc Af Amer: 66 mL/min — ABNORMAL LOW (ref >90)
GFR, EST NON AFRICAN AMERICAN: 57 mL/min — AB (ref >90)
Glucose, Bld: 93 mg/dL (ref 70–99)
Potassium: 3.9 mEq/L (ref 3.7–5.3)
SODIUM: 138 meq/L (ref 137–147)

## 2014-01-31 LAB — PRO B NATRIURETIC PEPTIDE: Pro B Natriuretic peptide (BNP): 71 pg/mL (ref 0–450)

## 2014-01-31 LAB — TROPONIN I

## 2014-01-31 MED ORDER — ALBUTEROL SULFATE (2.5 MG/3ML) 0.083% IN NEBU
5.0000 mg | INHALATION_SOLUTION | Freq: Once | RESPIRATORY_TRACT | Status: AC
  Administered 2014-01-31: 5 mg via RESPIRATORY_TRACT
  Filled 2014-01-31: qty 6

## 2014-01-31 MED ORDER — IPRATROPIUM BROMIDE 0.02 % IN SOLN
0.5000 mg | Freq: Once | RESPIRATORY_TRACT | Status: AC
  Administered 2014-01-31: 0.5 mg via RESPIRATORY_TRACT
  Filled 2014-01-31: qty 2.5

## 2014-01-31 NOTE — ED Notes (Signed)
Attempted to use a bedpan with the pt and the attempt was unsuccessful. Will reattempt at [place].

## 2014-01-31 NOTE — ED Notes (Signed)
Pt started having difficulty breathing and mucus coming out of her trach x 1 hour ago. Suction provides temporary relief. Pt is not able to speak but writes. Alert and oriented.

## 2014-02-01 LAB — URINALYSIS, ROUTINE W REFLEX MICROSCOPIC
Bilirubin Urine: NEGATIVE
Glucose, UA: NEGATIVE mg/dL
Ketones, ur: NEGATIVE mg/dL
NITRITE: NEGATIVE
PH: 7.5 (ref 5.0–8.0)
Protein, ur: NEGATIVE mg/dL
SPECIFIC GRAVITY, URINE: 1.014 (ref 1.005–1.030)
UROBILINOGEN UA: 0.2 mg/dL (ref 0.0–1.0)

## 2014-02-01 LAB — URINE MICROSCOPIC-ADD ON

## 2014-02-01 MED ORDER — DEXTROSE 5 % IV SOLN: 1.0000 g | Freq: Once | INTRAVENOUS | Status: DC

## 2014-02-01 MED ORDER — PREDNISONE 50 MG PO TABS: 50.0000 mg | ORAL_TABLET | Freq: Every day | ORAL | Status: DC

## 2014-02-01 MED ORDER — CEPHALEXIN 500 MG PO CAPS: 500.0000 mg | ORAL_CAPSULE | Freq: Two times a day (BID) | ORAL | Status: DC

## 2014-02-01 MED ORDER — METHYLPREDNISOLONE SODIUM SUCC 125 MG IJ SOLR: 125.0000 mg | Freq: Once | INTRAMUSCULAR | Status: DC

## 2014-02-01 MED ORDER — CEPHALEXIN 500 MG PO CAPS: 500.0000 mg | ORAL_CAPSULE | Freq: Once | ORAL | Status: DC

## 2014-02-01 NOTE — Discharge Instructions (Signed)
You have COPD exacerbation and a UTI. Please return to the ER if your symptoms worsen; you have increased pain, fevers, chills, inability to keep any medications down, confusion. Otherwise see the outpatient doctor as requested.   Chronic Obstructive Pulmonary Disease Exacerbation Chronic obstructive pulmonary disease (COPD) is a common lung condition in which airflow from the lungs is limited. COPD is a general term that can be used to describe many different lung problems that limit airflow, including chronic bronchitis and emphysema. COPD exacerbations are episodes when breathing symptoms become much worse and require extra treatment. Without treatment, COPD exacerbations can be life threatening, and frequent COPD exacerbations can cause further damage to your lungs. CAUSES   Respiratory infections.   Exposure to smoke.   Exposure to air pollution, chemical fumes, or dust. Sometimes there is no apparent cause or trigger. RISK FACTORS  Smoking cigarettes.  Older age.  Frequent prior COPD exacerbations. SIGNS AND SYMPTOMS   Increased coughing.   Increased thick spit (sputum) production.   Increased wheezing.   Increased shortness of breath.   Rapid breathing.   Chest tightness. DIAGNOSIS  Your medical history, a physical exam, and tests will help your health care provider make a diagnosis. Tests may include:  A chest X-ray.  Basic lab tests.  Sputum testing.  An arterial blood gas test. TREATMENT  Depending on the severity of your COPD exacerbation, you may need to be admitted to a hospital for treatment. Some of the treatments commonly used to treat COPD exacerbations are:   Antibiotic medicines.   Bronchodilators. These are drugs that expand the air passages. They may be given with an inhaler or nebulizer. Spacer devices may be needed to help improve drug delivery.  Corticosteroid medicines.  Supplemental oxygen therapy.  HOME CARE INSTRUCTIONS   Do  not smoke. Quitting smoking is very important to prevent COPD from getting worse and exacerbations from happening as often.  Avoid exposure to all substances that irritate the airway, especially to tobacco smoke.   If you were prescribed an antibiotic medicine, finish it all even if you start to feel better.  Take all medicines as directed by your health care provider.It is important to use correct technique with inhaled medicines.  Drink enough fluids to keep your urine clear or pale yellow (unless you have a medical condition that requires fluid restriction).  Use a cool mist vaporizer. This makes it easier to clear your chest when you cough.   If you have a home nebulizer and oxygen, continue to use them as directed.   Maintain all necessary vaccinations to prevent infections.   Exercise regularly.   Eat a healthy diet.   Keep all follow-up appointments as directed by your health care provider. SEEK IMMEDIATE MEDICAL CARE IF:  You have worsening shortness of breath.   You have trouble talking.   You have severe chest pain.  You have blood in your sputum.  You have a fever.  You have weakness, vomit repeatedly, or faint.   You feel confused.   You continue to get worse. MAKE SURE YOU:   Understand these instructions.  Will watch your condition.  Will get help right away if you are not doing well or get worse. Document Released: 04/14/2007 Document Revised: 11/01/2013 Document Reviewed: 02/19/2013 [facility] Patient Information 2015 [place], [place]. This information is not intended to replace advice given to you by your health care provider. Make sure you discuss any questions you have with your health care provider. Urinary Tract  Infection Urinary tract infections (UTIs) can develop anywhere along your urinary tract. Your urinary tract is your body's drainage system for removing wastes and extra water. Your urinary tract includes two kidneys, two ureters, a  bladder, and a urethra. Your kidneys are a pair of bean-shaped organs. Each kidney is about the size of your fist. They are located below your ribs, one on each side of your spine. CAUSES Infections are caused by microbes, which are microscopic organisms, including fungi, viruses, and bacteria. These organisms are so small that they can only be seen through a microscope. Bacteria are the microbes that most commonly cause UTIs. SYMPTOMS  Symptoms of UTIs may vary by age and gender of the patient and by the location of the infection. Symptoms in young women typically include a frequent and intense urge to urinate and a painful, burning feeling in the bladder or urethra during urination. Older women and men are more likely to be tired, shaky, and weak and have muscle aches and abdominal pain. A fever may mean the infection is in your kidneys. Other symptoms of a kidney infection include pain in your back or sides below the ribs, nausea, and vomiting. DIAGNOSIS To diagnose a UTI, your caregiver will ask you about your symptoms. Your caregiver also will ask to provide a urine sample. The urine sample will be tested for bacteria and white blood cells. White blood cells are made by your body to help fight infection. TREATMENT  Typically, UTIs can be treated with medication. Because most UTIs are caused by a bacterial infection, they usually can be treated with the use of antibiotics. The choice of antibiotic and length of treatment depend on your symptoms and the type of bacteria causing your infection. HOME CARE INSTRUCTIONS  If you were prescribed antibiotics, take them exactly as your caregiver instructs you. Finish the medication even if you feel better after you have only taken some of the medication.  Drink enough water and fluids to keep your urine clear or pale yellow.  Avoid caffeine, tea, and carbonated beverages. They tend to irritate your bladder.  Empty your bladder often. Avoid holding urine  for long periods of time.  Empty your bladder before and after sexual intercourse.  After a bowel movement, women should cleanse from front to back. Use each tissue only once. SEEK MEDICAL CARE IF:   You have back pain.  You develop a fever.  Your symptoms do not begin to resolve within 3 days. SEEK IMMEDIATE MEDICAL CARE IF:   You have severe back pain or lower abdominal pain.  You develop chills.  You have nausea or vomiting.  You have continued burning or discomfort with urination. MAKE SURE YOU:   Understand these instructions.  Will watch your condition.  Will get help right away if you are not doing well or get worse. Document Released: 03/27/2005 Document Revised: 12/17/2011 Document Reviewed: 07/26/2011 [facility] Patient Information 2015 [place], [place]. This information is not intended to replace advice given to you by your health care provider. Make sure you discuss any questions you have with your health care provider.

## 2014-02-01 NOTE — ED Notes (Signed)
Patient is alert and oriented x3.  She was given DC instructions and follow up visit instructions.  Patient gave written understanding. She was DC via stretcher r to home.  V/S stable.  sHe was not showing any signs of distress on DC

## 2014-02-01 NOTE — ED Provider Notes (Signed)
CSN: [id number]     Arrival date & time 01/31/14  2047 History   First MD Initiated Contact with Patient 01/31/14 2110     Chief Complaint  Patient presents with  . Shortness of Breath  . Tracheostomy problems      (Consider location/radiation/quality/duration/timing/severity/associated sxs/prior Treatment) HPI Comments: Pt with multiple medical problems including COPD, congestive heart failure, coronary artery disease, vocal cord paralysis - now with tracheostomy, and mediastinal mass, unknown primary. PT comes to the ER with cc of dib. Pt started having dib suddenly about an hour ago, and she had increased secretion with some chest discomfort.  No n/v/f/c/cough. Secretions are mostly clear.  Patient is a 80 y.o. female presenting with shortness of breath. The history is provided by the patient.  Shortness of Breath Associated symptoms: chest pain and cough   Associated symptoms: no abdominal pain, no neck pain, no vomiting and no wheezing     Past Medical History  Diagnosis Date  . Cancer     breast/lung/liver  . Cancer, metastatic   . COPD (chronic obstructive pulmonary disease)   . CHF (congestive heart failure)   . Complication of anesthesia     hard to wake up  . Hypertension   . Coronary artery disease   . Vocal cord paralysis    Past Surgical History  Procedure Laterality Date  . Thyroid surgery    . Cardiac surgery    . Abdominal surgery    . Breast surgery    . Small intestine surgery    . Tracheostomy     Family History  Problem Relation Age of Onset  . Hypertension Father   . Heart disease Father   . Hypertension Sister   . Hypertension Brother    History  Substance Use Topics  . Smoking status: Former Smoker -- 1.00 packs/day for 45 years    Types: Cigarettes    Quit date: 04/15/1969  . Smokeless tobacco: Never Used  . Alcohol Use: No   OB History   Grav Para Term Preterm Abortions TAB SAB Ect Mult Living                 Review of Systems   Constitutional: Negative for activity change.  HENT: Negative for facial swelling.   Respiratory: Positive for cough and shortness of breath. Negative for wheezing.   Cardiovascular: Positive for chest pain.  Gastrointestinal: Negative for nausea, vomiting, abdominal pain, diarrhea, constipation, blood in stool and abdominal distention.  Genitourinary: Negative for hematuria and difficulty urinating.  Musculoskeletal: Negative for neck pain.  Skin: Negative for color change.  Neurological: Negative for speech difficulty.  Hematological: Does not bruise/bleed easily.  Psychiatric/Behavioral: Negative for confusion.      Allergies  Review of patient's allergies indicates no known allergies.  Home Medications   Prior to Admission medications   Medication Sig Start Date End Date Taking? Authorizing Provider  albuterol (PROVENTIL HFA;VENTOLIN HFA) 108 (90 BASE) MCG/ACT inhaler Inhale 2 puffs into the lungs every 4 (four) hours as needed for wheezing or shortness of breath.     Historical Provider, MD  amLODipine (NORVASC) 10 MG tablet Take 10 mg by mouth daily.    Historical Provider, MD  aspirin 325 MG EC tablet Take 325 mg by mouth daily.    Historical Provider, MD  budesonide (PULMICORT) 0.5 MG/2ML nebulizer solution Take 0.5 mg by nebulization 2 (two) times daily.    Historical Provider, MD  bumetanide (BUMEX) 1 MG tablet Take 1 mg by  mouth daily.    Historical Provider, MD  erythromycin (ERY-TAB) 250 MG EC tablet Take 250 mg by mouth 3 (three) times daily.    Historical Provider, MD  Eszopiclone 3 MG TABS Take 3 mg by mouth at bedtime. Take immediately before bedtime    Historical Provider, MD  furosemide (LASIX) 40 MG tablet Take 40 mg by mouth daily.    Historical Provider, MD  HYDROcodone-acetaminophen (NORCO/VICODIN) 5-325 MG per tablet Take 1 tablet by mouth every 6 (six) hours as needed for moderate pain.    Historical Provider, MD  ipratropium-albuterol (DUONEB) 0.5-2.5 (3)  MG/3ML SOLN Take 3 mLs by nebulization 4 (four) times daily. May use 2 additional treatments PRN for increased symptoms. 09/06/13   Kathee Delton, MD  letrozole [facility]) 2.5 MG tablet Take 2.5 mg by mouth daily.    Historical Provider, MD  metoCLOPramide (REGLAN) 10 MG tablet Take 10 mg by mouth every 8 (eight) hours as needed for nausea.    Historical Provider, MD  Morphine Sulfate (MORPHINE CONCENTRATE) 10 mg / 0.5 ml concentrated solution Take 10 mg by mouth every 2 (two) hours as needed for severe pain.    Historical Provider, MD  pantoprazole (PROTONIX) 40 MG tablet Take 40 mg by mouth daily.    Historical Provider, MD  potassium chloride 20 MEQ/15ML (10%) solution Take 20 mEq by mouth daily.    Historical Provider, MD  protective barrier (RESTORE) CREA Apply 1 application topically daily. To buttocks/sore    Historical Provider, MD  rosuvastatin (CRESTOR) 10 MG tablet Take 10 mg by mouth daily.    Historical Provider, MD  senna (SENOKOT) 8.6 MG tablet Take 1 tablet by mouth every other day.    Historical Provider, MD  zolpidem (AMBIEN) 5 MG tablet Take 5 mg by mouth at bedtime as needed for sleep.    Historical Provider, MD   BP 135/52  Pulse 96  Temp(Src) 99.2 F (37.3 C) (Oral)  Resp 22  SpO2 100% Physical Exam  Nursing note and vitals reviewed. Constitutional: She is oriented to person, place, and time. She appears well-developed and well-nourished.  HENT:  Head: Normocephalic and atraumatic.  Eyes: EOM are normal. Pupils are equal, round, and reactive to light.  Neck: Neck supple. No JVD present.  Cardiovascular: Normal rate, regular rhythm and normal heart sounds.   No murmur heard. Pulmonary/Chest: Effort normal. No respiratory distress. She has wheezes. She has no rales.  Abdominal: Soft. She exhibits no distension. There is no tenderness. There is no rebound and no guarding.  Neurological: She is alert and oriented to person, place, and time.  Skin: Skin is warm and dry.     ED Course  Procedures (including critical care time) Labs Review Labs Reviewed  BASIC METABOLIC PANEL - Abnormal; Notable for the following:    Chloride 93 (*)    BUN 24 (*)    GFR calc non Af Amer 57 (*)    GFR calc Af Amer 66 (*)    All other components within normal limits  URINALYSIS, ROUTINE W REFLEX MICROSCOPIC - Abnormal; Notable for the following:    APPearance CLOUDY (*)    Hgb urine dipstick TRACE (*)    Leukocytes, UA LARGE (*)    All other components within normal limits  URINE MICROSCOPIC-ADD ON - Abnormal; Notable for the following:    Bacteria, UA FEW (*)    All other components within normal limits  CBC WITH DIFFERENTIAL  TROPONIN I  PRO B NATRIURETIC  PEPTIDE    Imaging Review Dg Chest 2 View  01/31/2014   CLINICAL DATA:  Chest pain and shortness of breath.  EXAM: CHEST  2 VIEW  COMPARISON:  01/02/2014.  FINDINGS: Chronic cardiomegaly. Probable CABG. No acute contour abnormality involving the aorta.  Tracheostomy tube which appears well positioned.  Increased markings at the left base is chronic, likely atelectasis or scarring based on previous CT. No evidence of significant effusion. No pneumothorax, with limitation of the left apex due to patient positioning.  Left mastectomy with implant and axillary dissection.  IMPRESSION: Stable exam.  No evidence of acute cardiopulmonary disease.   Electronically Signed   By: Jorje Guild M.D.   On: 01/31/2014 22:19     EKG Interpretation None      Date: 02/01/2014  Rate: 94  Rhythm: normal sinus rhythm  QRS Axis: normal  Intervals: normal  ST/T Wave abnormalities: nonspecific ST/T changes  Conduction Disutrbances:none  Narrative Interpretation:   Old EKG Reviewed: unchanged   MDM   Final diagnoses:  UTI (lower urinary tract infection)  COPD exacerbation    Pt comes in with cc of dyspnea, increased secretions. Lung exam has some wheezing. Xray show no infection.  Pt has 0 SIRS criteria, she was not  hypoxic or in resp distress. We had RT come and suction the patient - and there was hardly any aspirate. Solumedrol given for presumed copd exacerbation. No CHF like picture on exam and radiographs. On reassessment, patient looks a lot better, and her wheezing has resolved post albuterol.  UA shows infection - will treat with ceftriaxone, and then keflex.     Varney Biles, MD 02/01/14 0110

## 2014-02-03 ENCOUNTER — Telehealth: Payer: Self-pay | Admitting: *Deleted

## 2014-02-03 NOTE — Care Management (Signed)
02/03/2014 05:50 PM Phone (Outgoing) Danielle Bullock (Self)  ED CM left a voice message at [phone] for pt to inquire about her f/u appt with pulmonary provider Pending a return call from pt

## 2014-02-03 NOTE — Telephone Encounter (Signed)
Message copied by Barbaraann Faster on Thu Feb 03, 2014  5:49 PM ------      Message from: Varney Biles      Created: Tue Feb 01, 2014  1:19 AM      Regarding: Order for [facility]             Patient Name: [id number])      Sex: Female      DOB: [birth year]              PCP: JEGEDE, [place]: [facility]             Types of orders made on 02/01/2014: Consult, Lab, Medications, Nursing            Order Date:02/01/2014      Ordering Jari Sportsman [[id number]]      Attending Provider:Ankit Kathrynn Humble, MD [phone]      Authorizing Provider: Varney Biles, MD [phone]      Department:[facility]-EMERGENCY DEPT[[id number]]            Order Specific Information      Order: COPD clinic follow up [Custom: [id number]]  Order #: [id number]  Qty: 1        Priority: Routine  Class: Hospital Performed          Where does the patient receive follow up COPD care -> Other                 Follow up in -> 5-7 days               Released on: 02/01/2014  1:19 AM                    Priority: Routine  Class: Hospital Performed          Where does the patient receive follow up COPD care -> Other                 Follow up in -> 5-7 days               Released on: 02/01/2014  1:19 AM       ------

## 2014-02-11 NOTE — Progress Notes (Signed)
No return call from pt COPD f/u encounter closed

## 2014-02-27 ENCOUNTER — Encounter (HOSPITAL_COMMUNITY): Payer: Self-pay | Admitting: Emergency Medicine

## 2014-02-27 ENCOUNTER — Emergency Department (HOSPITAL_COMMUNITY)

## 2014-02-27 ENCOUNTER — Emergency Department (HOSPITAL_COMMUNITY)
Admission: EM | Admit: 2014-02-27 | Discharge: 2014-02-27 | Disposition: A | Discharge status: Home / Self Care | Attending: Emergency Medicine | Admitting: Emergency Medicine

## 2014-02-27 DIAGNOSIS — Y846 Urinary catheterization as the cause of abnormal reaction of the patient, or of later complication, without mention of misadventure at the time of the procedure: Secondary | ICD-10-CM | POA: Diagnosis not present

## 2014-02-27 DIAGNOSIS — I10 Essential (primary) hypertension: Secondary | ICD-10-CM | POA: Insufficient documentation

## 2014-02-27 DIAGNOSIS — Z792 Long term (current) use of antibiotics: Secondary | ICD-10-CM | POA: Insufficient documentation

## 2014-02-27 DIAGNOSIS — Z87891 Personal history of nicotine dependence: Secondary | ICD-10-CM | POA: Diagnosis not present

## 2014-02-27 DIAGNOSIS — J449 Chronic obstructive pulmonary disease, unspecified: Secondary | ICD-10-CM | POA: Insufficient documentation

## 2014-02-27 DIAGNOSIS — I509 Heart failure, unspecified: Secondary | ICD-10-CM | POA: Insufficient documentation

## 2014-02-27 DIAGNOSIS — Z853 Personal history of malignant neoplasm of breast: Secondary | ICD-10-CM | POA: Diagnosis not present

## 2014-02-27 DIAGNOSIS — Z8505 Personal history of malignant neoplasm of liver: Secondary | ICD-10-CM | POA: Diagnosis not present

## 2014-02-27 DIAGNOSIS — T839XXA Unspecified complication of genitourinary prosthetic device, implant and graft, initial encounter: Secondary | ICD-10-CM

## 2014-02-27 DIAGNOSIS — I2510 Atherosclerotic heart disease of native coronary artery without angina pectoris: Secondary | ICD-10-CM | POA: Insufficient documentation

## 2014-02-27 DIAGNOSIS — Z85118 Personal history of other malignant neoplasm of bronchus and lung: Secondary | ICD-10-CM | POA: Diagnosis not present

## 2014-02-27 DIAGNOSIS — Z859 Personal history of malignant neoplasm, unspecified: Secondary | ICD-10-CM | POA: Insufficient documentation

## 2014-02-27 DIAGNOSIS — Z7982 Long term (current) use of aspirin: Secondary | ICD-10-CM | POA: Insufficient documentation

## 2014-02-27 DIAGNOSIS — T83091A Other mechanical complication of indwelling urethral catheter, initial encounter: Secondary | ICD-10-CM | POA: Insufficient documentation

## 2014-02-27 DIAGNOSIS — J9500 Unspecified tracheostomy complication: Secondary | ICD-10-CM

## 2014-02-27 DIAGNOSIS — IMO0002 Reserved for concepts with insufficient information to code with codable children: Secondary | ICD-10-CM | POA: Insufficient documentation

## 2014-02-27 DIAGNOSIS — Z79899 Other long term (current) drug therapy: Secondary | ICD-10-CM | POA: Diagnosis not present

## 2014-02-27 DIAGNOSIS — J4489 Other specified chronic obstructive pulmonary disease: Secondary | ICD-10-CM | POA: Insufficient documentation

## 2014-02-27 LAB — CBC WITH DIFFERENTIAL/PLATELET
Basophils Absolute: 0 10*3/uL (ref 0.0–0.1)
Basophils Relative: 0 % (ref 0–1)
EOS ABS: 0 10*3/uL (ref 0.0–0.7)
Eosinophils Relative: 0 % (ref 0–5)
HEMATOCRIT: 39.4 % (ref 36.0–46.0)
Hemoglobin: 12.9 g/dL (ref 12.0–15.0)
Lymphocytes Relative: 10 % — ABNORMAL LOW (ref 12–46)
Lymphs Abs: 1 10*3/uL (ref 0.7–4.0)
MCH: 30 pg (ref 26.0–34.0)
MCHC: 32.7 g/dL (ref 30.0–36.0)
MCV: 91.6 fL (ref 78.0–100.0)
MONOS PCT: 11 % (ref 3–12)
Monocytes Absolute: 1.1 10*3/uL — ABNORMAL HIGH (ref 0.1–1.0)
Neutro Abs: 7.8 10*3/uL — ABNORMAL HIGH (ref 1.7–7.7)
Neutrophils Relative %: 79 % — ABNORMAL HIGH (ref 43–77)
Platelets: 348 10*3/uL (ref 150–400)
RBC: 4.3 MIL/uL (ref 3.87–5.11)
RDW: 13.6 % (ref 11.5–15.5)
WBC: 10 10*3/uL (ref 4.0–10.5)

## 2014-02-27 LAB — BASIC METABOLIC PANEL
Anion gap: 15 (ref 5–15)
BUN: 27 mg/dL — AB (ref 6–23)
CALCIUM: 9.9 mg/dL (ref 8.4–10.5)
CO2: 29 mEq/L (ref 19–32)
Chloride: 97 mEq/L (ref 96–112)
Creatinine, Ser: 0.91 mg/dL (ref 0.50–1.10)
GFR calc Af Amer: 62 mL/min — ABNORMAL LOW (ref >90)
GFR, EST NON AFRICAN AMERICAN: 54 mL/min — AB (ref >90)
GLUCOSE: 139 mg/dL — AB (ref 70–99)
Potassium: 3.3 mEq/L — ABNORMAL LOW (ref 3.7–5.3)
Sodium: 141 mEq/L (ref 137–147)

## 2014-02-27 LAB — I-STAT TROPONIN, ED: Troponin i, poc: 0 ng/mL (ref 0.00–0.08)

## 2014-02-27 MED ORDER — WHITE PETROLATUM GEL
Status: DC | PRN
  Filled 2014-02-27: qty 5

## 2014-02-27 NOTE — ED Notes (Signed)
Per EMS pt from home, on hospice, partially pulled out trach this morning, EMS was able to put it back in place and suction, also c/o left ankle pain and swelling and of leaking foley catheter. Per EMS hospice nurse advised family to come to ed. Pt is DNR.

## 2014-02-27 NOTE — ED Notes (Signed)
RT at bedside.

## 2014-02-27 NOTE — ED Notes (Signed)
Bed: WA01 Expected date: 02/27/14 Expected time: 11:11 AM Means of arrival: Ambulance Comments: Trach partly out, trach fixed, leaky cath bag, ankle swelling

## 2014-02-27 NOTE — ED Notes (Signed)
Bed: [facility]ALA Expected date:  Expected time:  Means of arrival:  Comments: 

## 2014-02-27 NOTE — Progress Notes (Signed)
RT asked to suction PT prior to discharge. RT passed # 12 french cath times 1- resulted in moderate, yellow, thick mucus. Sp02 on RA post suction remained at 97% (as pre suction). PT does not appear to be in respiratory distress at this time.

## 2014-02-27 NOTE — ED Provider Notes (Signed)
CSN: [id number]     Arrival date & time 02/27/14  1116 History   First MD Initiated Contact with Patient 02/27/14 1239     Chief Complaint  Patient presents with  . Ankle Pain  . trach problem   . leaking catheter    Level V caveat: Patient has a tracheostomy she is unable to speak HPI The patient has a history of multiple medical problems. She has vocal cord paralysis and has a tracheostomy. Patient is followed by home hospice. EMS was called the patient partially pulled her tracheostomy this morning. EMS was able to replace her tracheostomy and/or able to suction. The patient apparently also been complaining of some left ankle pain and some leaking of the patient's Foley catheter. Hospice was contacted and the patient was advised to come to the emergency room.  The patient is unable to speak to me. She appears rather debilitated.   Past Medical History  Diagnosis Date  . Cancer     breast/lung/liver  . Cancer, metastatic   . COPD (chronic obstructive pulmonary disease)   . CHF (congestive heart failure)   . Complication of anesthesia     hard to wake up  . Hypertension   . Coronary artery disease   . Vocal cord paralysis    Past Surgical History  Procedure Laterality Date  . Thyroid surgery    . Cardiac surgery    . Abdominal surgery    . Breast surgery    . Small intestine surgery    . Tracheostomy     Family History  Problem Relation Age of Onset  . Hypertension Father   . Heart disease Father   . Hypertension Sister   . Hypertension Brother    History  Substance Use Topics  . Smoking status: Former Smoker -- 1.00 packs/day for 45 years    Types: Cigarettes    Quit date: 04/15/1969  . Smokeless tobacco: Never Used  . Alcohol Use: No   OB History   Grav Para Term Preterm Abortions TAB SAB Ect Mult Living                 Review of Systems  Unable to perform ROS: Patient nonverbal      Allergies  Review of patient's allergies indicates no known  allergies.  Home Medications   Prior to Admission medications   Medication Sig Start Date End Date Taking? Authorizing Provider  albuterol (PROVENTIL HFA;VENTOLIN HFA) 108 (90 BASE) MCG/ACT inhaler Inhale 2 puffs into the lungs every 4 (four) hours as needed for wheezing or shortness of breath.     Historical Provider, MD  amLODipine (NORVASC) 10 MG tablet Take 10 mg by mouth daily.    Historical Provider, MD  aspirin 325 MG EC tablet Take 325 mg by mouth daily.    Historical Provider, MD  budesonide (PULMICORT) 0.5 MG/2ML nebulizer solution Take 0.5 mg by nebulization 2 (two) times daily.    Historical Provider, MD  bumetanide (BUMEX) 1 MG tablet Take 1 mg by mouth daily.    Historical Provider, MD  cephALEXin (KEFLEX) 500 MG capsule Take 1 capsule (500 mg total) by mouth 2 (two) times daily. 02/01/14   Varney Biles, MD  erythromycin (ERY-TAB) 250 MG EC tablet Take 250 mg by mouth 3 (three) times daily.    Historical Provider, MD  Eszopiclone 3 MG TABS Take 3 mg by mouth at bedtime. Take immediately before bedtime    Historical Provider, MD  furosemide (LASIX) 40 MG tablet  Take 40 mg by mouth daily.    Historical Provider, MD  HYDROcodone-acetaminophen (NORCO/VICODIN) 5-325 MG per tablet Take 1 tablet by mouth every 6 (six) hours as needed for moderate pain.    Historical Provider, MD  ipratropium-albuterol (DUONEB) 0.5-2.5 (3) MG/3ML SOLN Take 3 mLs by nebulization 4 (four) times daily. May use 2 additional treatments PRN for increased symptoms. 09/06/13   Kathee Delton, MD  letrozole [facility]) 2.5 MG tablet Take 2.5 mg by mouth daily.    Historical Provider, MD  metoCLOPramide (REGLAN) 10 MG tablet Take 10 mg by mouth every 8 (eight) hours as needed for nausea.    Historical Provider, MD  Morphine Sulfate (MORPHINE CONCENTRATE) 10 mg / 0.5 ml concentrated solution Take 10 mg by mouth every 2 (two) hours as needed for severe pain.    Historical Provider, MD  pantoprazole (PROTONIX) 40 MG tablet  Take 40 mg by mouth daily.    Historical Provider, MD  potassium chloride 20 MEQ/15ML (10%) solution Take 20 mEq by mouth daily.    Historical Provider, MD  predniSONE (DELTASONE) 50 MG tablet Take 1 tablet (50 mg total) by mouth daily. 02/01/14   Varney Biles, MD  protective barrier (RESTORE) CREA Apply 1 application topically daily. To buttocks/sore    Historical Provider, MD  rosuvastatin (CRESTOR) 10 MG tablet Take 10 mg by mouth daily.    Historical Provider, MD  senna (SENOKOT) 8.6 MG tablet Take 1 tablet by mouth every other day.    Historical Provider, MD  zolpidem (AMBIEN) 5 MG tablet Take 5 mg by mouth at bedtime as needed for sleep.    Historical Provider, MD   BP 141/65  Pulse 112  Temp(Src) 98.9 F (37.2 C) (Oral)  Resp 15  SpO2 96% Physical Exam  Nursing note and vitals reviewed. Constitutional: No distress.  Frail, elderly  HENT:  Head: Normocephalic and atraumatic.  Right Ear: External ear normal.  Left Ear: External ear normal.  Eyes: Conjunctivae are normal. Right eye exhibits no discharge. Left eye exhibits no discharge. No scleral icterus.  Neck: Neck supple. No tracheal deviation present.  Tracheostomy with good air movement through the tracheostomy. Kyphotic spine and neck  Cardiovascular: Normal rate, regular rhythm and intact distal pulses.   Pulmonary/Chest: Effort normal and breath sounds normal. No stridor. No respiratory distress. She has no wheezes. She has no rales.  Abdominal: Soft. Bowel sounds are normal. She exhibits no distension. There is no tenderness. There is no rebound and no guarding.  Musculoskeletal: She exhibits edema. She exhibits no tenderness.  No tenderness on exam questionable mild edema left ankle  Neurological: She is alert. She displays tremor. No cranial nerve deficit (no facial droop, extraocular movements intact, no slurred speech) or sensory deficit. She exhibits normal muscle tone. She displays no seizure activity. Coordination  normal.  Skin: Skin is warm and dry. No rash noted.  Psychiatric: She has a normal mood and affect.    ED Course  Procedures (including critical care time) Labs Review Labs Reviewed  CBC WITH DIFFERENTIAL - Abnormal; Notable for the following:    Neutrophils Relative % 79 (*)    Neutro Abs 7.8 (*)    Lymphocytes Relative 10 (*)    Monocytes Absolute 1.1 (*)    All other components within normal limits  BASIC METABOLIC PANEL - Abnormal; Notable for the following:    Potassium 3.3 (*)    Glucose, Bld 139 (*)    BUN 27 (*)  GFR calc non Af Amer 54 (*)    GFR calc Af Amer 62 (*)    All other components within normal limits  I-STAT TROPOININ, ED    Imaging Review Dg Chest 2 View  02/27/2014   CLINICAL DATA:  Tracheostomy tube removal and replacement.  EXAM: CHEST  2 VIEW  COMPARISON:  01/31/2014.  FINDINGS: Mediastinum and hilar structures are unremarkable. Tracheostomy tube appears to be in good anatomic position on AP view. It cannot be determined if the tracheostomy tube tip is anterior to the trachea on lateral view. Clinical correlation suggested. Basilar atelectasis. No pneumothorax. Cardiomegaly. Prior CABG. No CHF. No acute bony abnormality .  IMPRESSION: 1. Tracheostomy tube noted in good anatomic position on AP view. It cannot be determined the tip of the PICC is in anterior to the trachea lateral view. Clinical correlation suggested. 2. Prior CABG.   Electronically Signed   By: Marcello Moores  Register   On: 02/27/2014 13:45   Dg Ankle Complete Left  02/27/2014   CLINICAL DATA:  Left ankle pain  EXAM: LEFT ANKLE COMPLETE - 3+ VIEW  COMPARISON:  None.  FINDINGS: Mild soft tissue swelling is noted. Generalized osteopenia is seen. No acute fracture or dislocation is noted.  IMPRESSION: No acute abnormality noted.   Electronically Signed   By: Inez Catalina M.D.   On: 02/27/2014 13:43     EKG Interpretation   Date/Time:  Sunday February 27 2014 13:09:55 EDT Ventricular Rate:  105 PR  Interval:  200 QRS Duration: 74 QT Interval:  417 QTC Calculation: 551 R Axis:   -32 Text Interpretation:  Sinus tachycardia Left axis deviation Borderline T  abnormalities, diffuse leads Prolonged QT interval Baseline wander in  lead(s) I II aVR Confirmed by Makiyla Linch  MD-J, Messina Kosinski ([id number]) on 02/27/2014  1:27:41 PM      MDM   Final diagnoses:  Tracheostomy complication, unspecified  Complication of Foley catheter, initial encounter    The patient's tracheostomy does appear to be somewhat deviated towards the right however the patient's head position is very abnormal. Her chin is essentially resting on her chest.  The tracheostomy is patent.  The family asked have the respiratory therapist assisted tracheostomy. I Am hesitant to replace it at this time considering her head position and the fact that it is actually functioning properly at this time.  RT will clean and assess trach.  Patient's ankle x-rays unremarkable.  We will give her a new Foley catheter bag as the bag itself seems to have a leak.Dorie Rank, MD 02/27/14 [phone]

## 2014-02-27 NOTE — Progress Notes (Signed)
RT assessed  trach PT with #6 SH uncuffed. RT found trach to be partially removed from stoma. 2 RT's present while trach was reinserted into stoma (resulted in positive EndTidal), Sp02 on RA 98%, PT does not appear to be in distress at this time. RT performed trach care (cleaned IC, cleaned flange, replaced trach tie). RT suctioned PT times 2 with # 12 french cath- resulted in small, yellow, thick mucus.

## 2014-03-09 ENCOUNTER — Emergency Department (HOSPITAL_COMMUNITY)

## 2014-03-09 ENCOUNTER — Emergency Department (HOSPITAL_COMMUNITY)
Admission: EM | Admit: 2014-03-09 | Discharge: 2014-03-09 | Disposition: A | Discharge status: Home / Self Care | Attending: Emergency Medicine | Admitting: Emergency Medicine

## 2014-03-09 ENCOUNTER — Encounter (HOSPITAL_COMMUNITY): Payer: Self-pay | Admitting: Emergency Medicine

## 2014-03-09 DIAGNOSIS — I2510 Atherosclerotic heart disease of native coronary artery without angina pectoris: Secondary | ICD-10-CM | POA: Insufficient documentation

## 2014-03-09 DIAGNOSIS — R609 Edema, unspecified: Secondary | ICD-10-CM | POA: Insufficient documentation

## 2014-03-09 DIAGNOSIS — Z515 Encounter for palliative care: Secondary | ICD-10-CM | POA: Diagnosis not present

## 2014-03-09 DIAGNOSIS — N189 Chronic kidney disease, unspecified: Secondary | ICD-10-CM | POA: Diagnosis not present

## 2014-03-09 DIAGNOSIS — I129 Hypertensive chronic kidney disease with stage 1 through stage 4 chronic kidney disease, or unspecified chronic kidney disease: Secondary | ICD-10-CM | POA: Insufficient documentation

## 2014-03-09 DIAGNOSIS — K117 Disturbances of salivary secretion: Secondary | ICD-10-CM | POA: Diagnosis not present

## 2014-03-09 DIAGNOSIS — IMO0002 Reserved for concepts with insufficient information to code with codable children: Secondary | ICD-10-CM | POA: Insufficient documentation

## 2014-03-09 DIAGNOSIS — Z7982 Long term (current) use of aspirin: Secondary | ICD-10-CM | POA: Diagnosis not present

## 2014-03-09 DIAGNOSIS — J441 Chronic obstructive pulmonary disease with (acute) exacerbation: Secondary | ICD-10-CM | POA: Insufficient documentation

## 2014-03-09 DIAGNOSIS — Z931 Gastrostomy status: Secondary | ICD-10-CM | POA: Diagnosis not present

## 2014-03-09 DIAGNOSIS — N390 Urinary tract infection, site not specified: Secondary | ICD-10-CM | POA: Diagnosis not present

## 2014-03-09 DIAGNOSIS — Z859 Personal history of malignant neoplasm, unspecified: Secondary | ICD-10-CM | POA: Diagnosis not present

## 2014-03-09 DIAGNOSIS — Z853 Personal history of malignant neoplasm of breast: Secondary | ICD-10-CM | POA: Insufficient documentation

## 2014-03-09 DIAGNOSIS — I509 Heart failure, unspecified: Secondary | ICD-10-CM | POA: Diagnosis not present

## 2014-03-09 DIAGNOSIS — Z79899 Other long term (current) drug therapy: Secondary | ICD-10-CM | POA: Insufficient documentation

## 2014-03-09 DIAGNOSIS — Z87891 Personal history of nicotine dependence: Secondary | ICD-10-CM | POA: Diagnosis not present

## 2014-03-09 DIAGNOSIS — G529 Cranial nerve disorder, unspecified: Secondary | ICD-10-CM | POA: Insufficient documentation

## 2014-03-09 DIAGNOSIS — H5789 Other specified disorders of eye and adnexa: Secondary | ICD-10-CM | POA: Diagnosis not present

## 2014-03-09 DIAGNOSIS — R4182 Altered mental status, unspecified: Secondary | ICD-10-CM | POA: Diagnosis not present

## 2014-03-09 DIAGNOSIS — Z792 Long term (current) use of antibiotics: Secondary | ICD-10-CM | POA: Diagnosis not present

## 2014-03-09 LAB — COMPREHENSIVE METABOLIC PANEL
ALK PHOS: 375 U/L — AB (ref 39–117)
ALT: 126 U/L — ABNORMAL HIGH (ref 0–35)
AST: 131 U/L — ABNORMAL HIGH (ref 0–37)
Albumin: 2.8 g/dL — ABNORMAL LOW (ref 3.5–5.2)
Anion gap: 13 (ref 5–15)
BUN: 41 mg/dL — AB (ref 6–23)
CO2: 34 meq/L — AB (ref 19–32)
Calcium: 9.4 mg/dL (ref 8.4–10.5)
Chloride: 108 mEq/L (ref 96–112)
Creatinine, Ser: 1.06 mg/dL (ref 0.50–1.10)
GFR, EST AFRICAN AMERICAN: 52 mL/min — AB (ref >90)
GFR, EST NON AFRICAN AMERICAN: 45 mL/min — AB (ref >90)
Glucose, Bld: 151 mg/dL — ABNORMAL HIGH (ref 70–99)
POTASSIUM: 3.6 meq/L — AB (ref 3.7–5.3)
Sodium: 155 mEq/L — ABNORMAL HIGH (ref 137–147)
TOTAL PROTEIN: 7.7 g/dL (ref 6.0–8.3)

## 2014-03-09 LAB — CBC WITH DIFFERENTIAL/PLATELET
Basophils Absolute: 0 10*3/uL (ref 0.0–0.1)
Basophils Relative: 0 % (ref 0–1)
EOS ABS: 0.1 10*3/uL (ref 0.0–0.7)
Eosinophils Relative: 1 % (ref 0–5)
HCT: 45.1 % (ref 36.0–46.0)
HEMOGLOBIN: 13.9 g/dL (ref 12.0–15.0)
LYMPHS PCT: 14 % (ref 12–46)
Lymphs Abs: 1.9 10*3/uL (ref 0.7–4.0)
MCH: 29.4 pg (ref 26.0–34.0)
MCHC: 30.8 g/dL (ref 30.0–36.0)
MCV: 95.6 fL (ref 78.0–100.0)
MONOS PCT: 6 % (ref 3–12)
Monocytes Absolute: 0.8 10*3/uL (ref 0.1–1.0)
Neutro Abs: 11.1 10*3/uL — ABNORMAL HIGH (ref 1.7–7.7)
Neutrophils Relative %: 79 % — ABNORMAL HIGH (ref 43–77)
Platelets: 306 10*3/uL (ref 150–400)
RBC: 4.72 MIL/uL (ref 3.87–5.11)
RDW: 14.4 % (ref 11.5–15.5)
WBC: 13.9 10*3/uL — ABNORMAL HIGH (ref 4.0–10.5)

## 2014-03-09 LAB — URINE MICROSCOPIC-ADD ON

## 2014-03-09 LAB — URINALYSIS, ROUTINE W REFLEX MICROSCOPIC
BILIRUBIN URINE: NEGATIVE
Glucose, UA: NEGATIVE mg/dL
KETONES UR: NEGATIVE mg/dL
Nitrite: NEGATIVE
Protein, ur: NEGATIVE mg/dL
SPECIFIC GRAVITY, URINE: 1.019 (ref 1.005–1.030)
UROBILINOGEN UA: 0.2 mg/dL (ref 0.0–1.0)
pH: 5.5 (ref 5.0–8.0)

## 2014-03-09 LAB — I-STAT CG4 LACTIC ACID, ED: Lactic Acid, Venous: 1.07 mmol/L (ref 0.5–2.2)

## 2014-03-09 MED ORDER — CEFTRIAXONE SODIUM 1 G IJ SOLR
1.0000 g | Freq: Once | INTRAMUSCULAR | Status: AC
  Administered 2014-03-09: 1 g via INTRAVENOUS
  Filled 2014-03-09: qty 10

## 2014-03-09 MED ORDER — CIPROFLOXACIN 500 MG/5ML (10%) PO SUSR
500.0000 mg | Freq: Two times a day (BID) | ORAL | Status: AC
Start: 2014-03-09 — End: 2014-03-16

## 2014-03-09 NOTE — ED Notes (Signed)
Discharge instructions reviewed with daughter Ednamae Schiano) she verbalizes understanding and need to follow up and take antibiotic.  [facility] called for transport back home. DNR form placed with discharge instructions.

## 2014-03-09 NOTE — ED Notes (Signed)
Awaiting [facility] arrival

## 2014-03-09 NOTE — ED Notes (Signed)
Per EMS pt comes from home where she is not being as responsive to her norm, pt is only responsive to pain.  Pt is normally non verbal.  Pt has end stage kidney cancer. Pt has indwelling foley. Pt is DNR.   BP 133/73, HR 92, RR 20, O2 100% via blow by via trach.

## 2014-03-09 NOTE — ED Notes (Signed)
Her daughter phones to give mom's address as [place]. here in [place].

## 2014-03-09 NOTE — ED Notes (Signed)
Called Pt's daughter, Rosann Auerbach, [place] stating pt is up for discharge and will be contacting [facility] for transportation back to her residence.

## 2014-03-09 NOTE — ED Provider Notes (Signed)
CSN: [id number]     Arrival date & time 03/09/14  1110 History   First MD Initiated Contact with Patient 03/09/14 1111     Chief Complaint  Patient presents with  . Altered Mental Status     (Consider location/radiation/quality/duration/timing/severity/associated sxs/prior Treatment) HPI Comments: 80 year old female on hospice care, history of COPD, chronic kidney disease, high blood pressure, CAD, breast cancer with metastasis, trach presents with altered mental status. Patient was at her baseline yesterday interactive and this morning patient was unresponsive. Possibly facial droop and drooling. No history of stroke or known metastases to the brain. Family is on route, hospice nurse at the bedside.  Patient is a 80 y.o. female presenting with altered mental status. The history is provided by medical records and a relative.  Altered Mental Status   Past Medical History  Diagnosis Date  . Cancer     breast/lung/liver  . Cancer, metastatic   . COPD (chronic obstructive pulmonary disease)   . CHF (congestive heart failure)   . Complication of anesthesia     hard to wake up  . Hypertension   . Coronary artery disease   . Vocal cord paralysis    Past Surgical History  Procedure Laterality Date  . Thyroid surgery    . Cardiac surgery    . Abdominal surgery    . Breast surgery    . Small intestine surgery    . Tracheostomy     Family History  Problem Relation Age of Onset  . Hypertension Father   . Heart disease Father   . Hypertension Sister   . Hypertension Brother    History  Substance Use Topics  . Smoking status: Former Smoker -- 1.00 packs/day for 45 years    Types: Cigarettes    Quit date: 04/15/1969  . Smokeless tobacco: Never Used  . Alcohol Use: No   OB History   Grav Para Term Preterm Abortions TAB SAB Ect Mult Living                 Review of Systems  Unable to perform ROS: Mental status change      Allergies  Review of patient's allergies  indicates no known allergies.  Home Medications   Prior to Admission medications   Medication Sig Start Date End Date Taking? Authorizing Provider  albuterol (PROVENTIL HFA;VENTOLIN HFA) 108 (90 BASE) MCG/ACT inhaler Inhale 2 puffs into the lungs every 4 (four) hours as needed for wheezing or shortness of breath.     Historical Provider, MD  amLODipine (NORVASC) 10 MG tablet Take 10 mg by mouth daily.    Historical Provider, MD  aspirin 325 MG EC tablet Take 325 mg by mouth daily.    Historical Provider, MD  budesonide (PULMICORT) 0.5 MG/2ML nebulizer solution Take 0.5 mg by nebulization 2 (two) times daily.    Historical Provider, MD  bumetanide (BUMEX) 1 MG tablet Take 1 mg by mouth daily.    Historical Provider, MD  cephALEXin (KEFLEX) 500 MG capsule Take 1 capsule (500 mg total) by mouth 2 (two) times daily. 02/01/14   Varney Biles, MD  erythromycin (ERY-TAB) 250 MG EC tablet Take 250 mg by mouth 3 (three) times daily.    Historical Provider, MD  Eszopiclone 3 MG TABS Take 3 mg by mouth at bedtime. Take immediately before bedtime    Historical Provider, MD  furosemide (LASIX) 40 MG tablet Take 40 mg by mouth daily.    Historical Provider, MD  HYDROcodone-acetaminophen (NORCO/VICODIN) 5-325 MG  per tablet Take 1 tablet by mouth every 6 (six) hours as needed for moderate pain.    Historical Provider, MD  ipratropium-albuterol (DUONEB) 0.5-2.5 (3) MG/3ML SOLN Take 3 mLs by nebulization 4 (four) times daily. May use 2 additional treatments PRN for increased symptoms. 09/06/13   Kathee Delton, MD  letrozole [facility]) 2.5 MG tablet Take 2.5 mg by mouth daily.    Historical Provider, MD  metoCLOPramide (REGLAN) 10 MG tablet Take 10 mg by mouth every 8 (eight) hours as needed for nausea.    Historical Provider, MD  Morphine Sulfate (MORPHINE CONCENTRATE) 10 mg / 0.5 ml concentrated solution Take 10 mg by mouth every 2 (two) hours as needed for severe pain.    Historical Provider, MD  pantoprazole  (PROTONIX) 40 MG tablet Take 40 mg by mouth daily.    Historical Provider, MD  potassium chloride 20 MEQ/15ML (10%) solution Take 20 mEq by mouth daily.    Historical Provider, MD  predniSONE (DELTASONE) 50 MG tablet Take 1 tablet (50 mg total) by mouth daily. 02/01/14   Varney Biles, MD  protective barrier (RESTORE) CREA Apply 1 application topically daily. To buttocks/sore    Historical Provider, MD  rosuvastatin (CRESTOR) 10 MG tablet Take 10 mg by mouth daily.    Historical Provider, MD  senna (SENOKOT) 8.6 MG tablet Take 1 tablet by mouth every other day.    Historical Provider, MD   BP 129/55  Pulse 93  Temp(Src) 98.9 F (37.2 C) (Oral)  Resp 20  SpO2 95% Physical Exam  Nursing note and vitals reviewed. Constitutional: She appears well-developed and well-nourished.  HENT:  Head: Normocephalic and atraumatic.  Mild drooling, possibly mild left facial droop.  Eyes: Right eye exhibits discharge. Left eye exhibits discharge.  Neck: Neck supple. No tracheal deviation present.  Cardiovascular: Normal rate and regular rhythm.   Pulmonary/Chest: Respiratory distress: decreased effort. Wheezes:  difficult exam due to decreased effort. She has rales (few bilateral).  Abdominal: Soft. She exhibits no distension (G-tube in place). There is no tenderness. There is no guarding.  Musculoskeletal: She exhibits edema (mild lower extremity bilateral).  Neurological: A cranial nerve deficit is present. GCS eye subscore is 2. GCS verbal subscore is 2. GCS motor subscore is 3.  Patient's somnolence, no response to pain or loud verbal, weakness bilateral. Difficult neuro exam. Nonverbal, trach in place.  Skin: Skin is warm. No rash noted.  Psychiatric:  Unresponsive    ED Course  Procedures (including critical care time) Labs Review Labs Reviewed  CBC WITH DIFFERENTIAL - Abnormal; Notable for the following:    WBC 13.9 (*)    Neutrophils Relative % 79 (*)    Neutro Abs 11.1 (*)    All other  components within normal limits  COMPREHENSIVE METABOLIC PANEL - Abnormal; Notable for the following:    Sodium 155 (*)    Potassium 3.6 (*)    CO2 34 (*)    Glucose, Bld 151 (*)    BUN 41 (*)    Albumin 2.8 (*)    AST 131 (*)    ALT 126 (*)    Alkaline Phosphatase 375 (*)    Total Bilirubin <0.2 (*)    GFR calc non Af Amer 45 (*)    GFR calc Af Amer 52 (*)    All other components within normal limits  URINALYSIS, ROUTINE W REFLEX MICROSCOPIC - Abnormal; Notable for the following:    APPearance CLOUDY (*)    Hgb urine dipstick  LARGE (*)    Leukocytes, UA MODERATE (*)    All other components within normal limits  URINE MICROSCOPIC-ADD ON - Abnormal; Notable for the following:    Squamous Epithelial / LPF FEW (*)    Bacteria, UA FEW (*)    All other components within normal limits  I-STAT CG4 LACTIC ACID, ED    Imaging Review Dg Chest Portable 1 View  03/09/2014   CLINICAL DATA:  Altered mental status. COPD. Congestive heart failure. Breast carcinoma.  EXAM: PORTABLE CHEST - 1 VIEW  COMPARISON:  02/27/2014  FINDINGS: Mild cardiomegaly is stable. Prior CABG again noted. Tracheostomy tube is seen in place. Both lungs are clear. No evidence of pneumothorax or pleural effusion. Surgical clips again seen in the left axilla.  IMPRESSION: No active disease.   Electronically Signed   By: Earle Gell M.D.   On: 03/09/2014 12:58     EKG Interpretation None      MDM   Final diagnoses:  UTI (lower urinary tract infection)  Palliative care encounter  Altered mental status, unspecified altered mental status type  Hypernatremia  Patient presents with altered mental status and unresponsive, vitals unremarkable. Called primary caregiver family member and discuss goals of care. The daughter   Does not want CT head as it will not change management, daughter is comfortable basic blood work, urinalysis and chest x-ray to look for signs of infection that may be treatable to improve her status  with antibiotics. Patient has an indwelling Foley.  20 remains altered during ED visit. Daughter arrives and long discussion in terms of goals of care. Daughter is comfortable with taking her home giving antibiotics through the G-tube and outpatient followup. The daughter does not want any aggressive care done and goal to keep patient at home if possible. Palliative care to followup outpatient.  Results and differential diagnosis were discussed with the patient/parent/guardian. Close follow up outpatient was discussed, comfortable with the plan.   Medications  cefTRIAXone (ROCEPHIN) 1 g in dextrose 5 % 50 mL IVPB (1 g Intravenous New Bag/Given 03/09/14 1435)    Filed Vitals:   03/09/14 1345 03/09/14 1400 03/09/14 1415 03/09/14 1430  BP: 151/83 151/83 136/83 134/85  Pulse:      Temp:      TempSrc:      Resp: 29 23 20 24   SpO2:             Mariea Clonts, MD 03/09/14 1452

## 2014-03-09 NOTE — ED Notes (Signed)
Bed: NB39 Expected date:  Expected time:  Means of arrival:  Comments: ems

## 2014-03-09 NOTE — ED Notes (Addendum)
[facility] at bedside. DNR form sent with [facility]

## 2014-03-09 NOTE — Progress Notes (Addendum)
ED RN visit- [facility] ED room 10-[facility])-K Alford Highland RN  Pt to ED via EMS d/t decreased responsiveness. Pt is DNR code. Pt seen at bedside,head hanging to her left shoulder,  did not respond to voice or touch initially. Skin cool to touch,  Oxygen via trach collar, wheezing audible. Pt with relaxed facial muscles and extremities, no nonverbal s/s of pain noted. Foley draining light yellow urine.  ED Md Dr. Reather Converse in during visit, patient's daughter Rosann Auerbach contacted via phone and discussed treatment options with MD. At this time treatable sources of AMS to be explored; labs, cxr, no head CT.  Family planning to come to the ED, not present during RN visit. Pt appeared comfortable, did respond to voice by opening her eyes by end of visit. Current medication list given to pharmacy tech. [facility] will continue to follow.  Flo Shanks RN, BNS, [facility] Liaison [phone]

## 2014-03-21 ENCOUNTER — Ambulatory Visit: Payer: Self-pay | Admitting: Internal Medicine

## 2014-03-31 DEATH — deceased

## 2014-07-13 ENCOUNTER — Ambulatory Visit: Payer: Self-pay | Admitting: Pulmonary Disease

## 2014-07-14 ENCOUNTER — Telehealth: Payer: Self-pay

## 2014-07-14 NOTE — Telephone Encounter (Signed)
Patient died @ [facility] Home per Iver Nestle
